# Patient Record
Sex: Female | Born: 1950 | Race: White | Hispanic: No | Marital: Married | Smoking: Never smoker
Health system: Southern US, Community
[De-identification: ages and names within clinical notes are randomized; demographics above are authoritative.]

## PROBLEM LIST (undated history)

## (undated) DIAGNOSIS — I10 Essential (primary) hypertension: Secondary | ICD-10-CM

## (undated) DIAGNOSIS — I493 Ventricular premature depolarization: Secondary | ICD-10-CM

## (undated) DIAGNOSIS — F0390 Unspecified dementia without behavioral disturbance: Secondary | ICD-10-CM

## (undated) DIAGNOSIS — R0789 Other chest pain: Secondary | ICD-10-CM

## (undated) DIAGNOSIS — I480 Paroxysmal atrial fibrillation: Secondary | ICD-10-CM

## (undated) DIAGNOSIS — E785 Hyperlipidemia, unspecified: Secondary | ICD-10-CM

## (undated) HISTORY — DX: Hyperlipidemia, unspecified: E78.5

## (undated) HISTORY — DX: Essential (primary) hypertension: I10

## (undated) HISTORY — DX: Paroxysmal atrial fibrillation: I48.0

## (undated) HISTORY — DX: Ventricular premature depolarization: I49.3

## (undated) HISTORY — DX: Other chest pain: R07.89

---

## 2007-12-19 ENCOUNTER — Ambulatory Visit: Payer: Self-pay | Admitting: Cardiology

## 2008-01-09 ENCOUNTER — Ambulatory Visit: Payer: Self-pay | Admitting: Cardiology

## 2010-11-24 NOTE — Assessment & Plan Note (Signed)
[facility]                          [facility] NOTE   CARLO, LORSON                      MRN:          [id number]  DATE:01/09/2008                            DOB:          1951-03-26    REFERRING PHYSICIAN:  Ernestina Penna, M.D.   HISTORY OF PRESENT ILLNESS:  The patient is a very pleasant 60 year old  female seen in the hospital when she was performing a treadmill test.  There was some concern at that time that the patient would have an  ectopic atrial pacemaker versus early atrial fibrillation.  The patient  wore a CardioNet monitor.  She had a single episode of a brief run of  atrial fibrillation and occasional PACs.  She otherwise has been  relatively asymptomatic.  Her dyspnea has also been improving.  She has  no chest pain.  An echocardiogram was done and showed no structural  heart disease.   MEDICATIONS:  Lisinopril/hydrochlorothiazide 20/12.5 mg p.o. daily.   PHYSICAL EXAMINATION:  VITAL SIGNS:  Blood pressure 144/84 and heart  rate is 74 beats per minute.  NECK:  Normal carotid upstroke and no carotid bruits.  LUNGS:  Clear breath sounds bilaterally.  HEART:  Regular rate and rhythm.  Normal S1 and S2.  No murmurs, rubs,  or gallops.  ABDOMEN:  Soft and nontender.  No rebound or guarding.  Good bowel  sounds.  EXTREMITY:  No cyanosis, clubbing, or edema.  NEUROLOGIC:  The patient is alert and oriented, grossly nonfocal.   Electrocardiogram, normal sinus rhythm.  No acute abnormalities.   PROBLEMS:  1. Hypertensive heart disease.  2. Rule out sick sinus syndrome with ectopic atrial beats.  3. Brief run of atrial fibrillation.  4. Normal left ventricular function.   PLAN:  1. The patient is doing well.  Dyspnea appears to be improving.  I do      not think that she has significant coronary artery disease.  Her      stress test did not suggest ischemic changes.  She also reports no      chest pain during a  stress testing.  2. I do not think the patient needs an ischemia workup at the present      time.  She is asymptomatic and I have reassured her about PACs and      a brief run of atrial fibrillation.  There is no indication for      Coumadin either.  3. The patient can follow up with [place] in the next 6 months to a year,      did tell her to take her      blood pressure and if she has recurrent palpitations, certainly a      low-dose beta-blocker can be added to her medical regimen.     Learta Codding, MD,FACC  Electronically Signed    GED/MedQ  DD: 01/09/2008  DT: 01/10/2008  Job #: [id number]   cc:   Ernestina Penna, M.D.

## 2011-01-20 ENCOUNTER — Emergency Department (HOSPITAL_COMMUNITY): Payer: Self-pay

## 2011-01-20 ENCOUNTER — Observation Stay (HOSPITAL_COMMUNITY)
Admission: EM | Admit: 2011-01-20 | Discharge: 2011-01-21 | Disposition: A | Discharge status: Home / Self Care | Payer: Self-pay | Attending: Cardiology | Admitting: Cardiology

## 2011-01-20 DIAGNOSIS — R079 Chest pain, unspecified: Secondary | ICD-10-CM

## 2011-01-20 DIAGNOSIS — R0789 Other chest pain: Principal | ICD-10-CM | POA: Insufficient documentation

## 2011-01-20 DIAGNOSIS — I4891 Unspecified atrial fibrillation: Secondary | ICD-10-CM | POA: Insufficient documentation

## 2011-01-20 DIAGNOSIS — E785 Hyperlipidemia, unspecified: Secondary | ICD-10-CM | POA: Insufficient documentation

## 2011-01-20 DIAGNOSIS — I10 Essential (primary) hypertension: Secondary | ICD-10-CM | POA: Insufficient documentation

## 2011-01-20 LAB — POCT I-STAT, CHEM 8
Creatinine, Ser: 0.8 mg/dL (ref 0.50–1.10)
Hemoglobin: 13.3 g/dL (ref 12.0–15.0)
Sodium: 141 mEq/L (ref 135–145)
TCO2: 25 mmol/L (ref 0–100)

## 2011-01-20 LAB — CK TOTAL AND CKMB (NOT AT ARMC): Total CK: 88 U/L (ref 7–177)

## 2011-01-20 LAB — CBC
Hemoglobin: 13.6 g/dL (ref 12.0–15.0)
MCH: 30.9 pg (ref 26.0–34.0)
MCHC: 35.2 g/dL (ref 30.0–36.0)
Platelets: 232 10*3/uL (ref 150–400)

## 2011-01-20 LAB — TSH: TSH: 1.53 u[IU]/mL (ref 0.350–4.500)

## 2011-01-21 DIAGNOSIS — R072 Precordial pain: Secondary | ICD-10-CM

## 2011-01-21 LAB — BASIC METABOLIC PANEL
BUN: 21 mg/dL (ref 6–23)
Calcium: 8.8 mg/dL (ref 8.4–10.5)
Creatinine, Ser: 0.76 mg/dL (ref 0.50–1.10)
GFR calc non Af Amer: >60 mL/min (ref >60)
Glucose, Bld: 95 mg/dL (ref 70–99)
Sodium: 140 mEq/L (ref 135–145)

## 2011-01-21 LAB — CARDIAC PANEL(CRET KIN+CKTOT+MB+TROPI)
CK, MB: 2.2 ng/mL (ref 0.3–4.0)
Relative Index: INVALID (ref 0.0–2.5)
Total CK: 71 U/L (ref 7–177)
Total CK: 71 U/L (ref 7–177)
Troponin I: <0.3 ng/mL (ref <0.30)

## 2011-01-21 LAB — HEMOGLOBIN A1C
Hgb A1c MFr Bld: 5.6 % (ref <5.7)
Mean Plasma Glucose: 114 mg/dL (ref <117)

## 2011-01-21 LAB — LIPID PANEL: LDL Cholesterol: 145 mg/dL — ABNORMAL HIGH (ref 0–99)

## 2011-01-22 NOTE — Discharge Summary (Addendum)
Joy Bender, Joy Bender NO.:  [id number]  MEDICAL RECORD NO.:  [id number]  LOCATION:  2040                         FACILITY:  [facility]  PHYSICIAN:  Luis Abed, MD, FACCDATE OF BIRTH:  1951/01/23  DATE OF ADMISSION:  01/20/2011 DATE OF DISCHARGE:  01/21/2011                              DISCHARGE SUMMARY   DISCHARGE DIAGNOSES: 1. Chest pain, felt atypical.     a.     Cardiac enzymes negative x3.     b.     Stress echocardiogram, January 21, 2011, showed normal wall      motion and poor exercise tolerance, premature atrial contractions      on monitor with exercise with no sustained supraventricular      tachycardia. 2. Hypertension. 3. Hyperlipidemia with LDL of 145, for outpatient followup with     primary care physician. 4. History of palpitations with CardioNet monitor in 2009 showing     single episode of a brief run of atrial fibrillation and occasional     premature atrial contractions. 5. History of normal left ventricular function. 6. 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  HOSPITAL COURSE:  Joy Bender is a 60 year old female with a history as outlined above who presented to [facility] complains of chest discomfort.  She was cutting her husband's hair when she had a sudden onset of acute chest pain.  She took an aspirin, laid down, but pain was not resolved.  She described it as a constant pressure sensation that was nonradiating and nonpositional.  When she presented to her PCP's office, she received a sublingual nitroglycerin and aspirin upon which she did feel relief.  She did note some palpitations over the last few weeks.  When seen in the ER which is where she was referred to, the patient had no chest pain.  The patient's initial troponin was negative. On telemetry, it was noted that she had PACs and PVCs without acute changes on EKG.  Cardiac enzymes were negative x3.  She was subsequently referred for a treadmill stress  echocardiogram which was performed today, January 21, 2011.  Although the patient had poor exercise tolerance, making it approximately 4-1/2 minute by the Bruce protocol, her images revealed normal wall motion.  Her EKG strips showed quickly elevated heart rate, some irregularity felt at this time to be consistent with PAC.  There was no sustained supraventricular tachycardia demonstrated per review by Dr. Myrtis Ser.  He has seen and examined her today and feels she is stable for discharge.  Per discussion with him, he would like the patient to increase activity to increase her exercise tolerance.  She was started on low-dose beta-blocker here in the hospital, however, her blood pressures are on the lower side, so we will hold off pending improvement of such.  DISCHARGE LABORATORY DATA:  WBC 6.8, hemoglobin 13.3, hematocrit 39, and platelet count 232.  Sodium 140, potassium 4.1, chloride 104, CO2 26, glucose 95, and BUN 21.  A1c 5.6.  Cardiac enzymes negative x3.  Total cholesterol 214, triglycerides 144, HDL 40, and LDL 145.  STUDIES: 1. No cardiopulmonary abnormality or acute or abnormal processes seen.     Ectasia/tortuosity  of thoracic aorta. 2. Stress echocardiogram, January 21, 2011 showed frequent PACs.  A     stress EKG with no prolonged SVT.  No definite ischemia.  No chest     pain.  EF was 60% with normal wall motion, both at baseline and     peak stress.  DISCHARGE MEDICATIONS: 1. Aspirin 81 mg daily. 2. Crestor 10 mg.  The patient is only taking this weekly.  She is     apparently skeptical and fearful of the medication.  Dr. Myrtis Ser would     like her to follow up with her primary care doctor to assess with     medicine in relation to her high cholesterol level. 3. Lisinopril/hydrochlorothiazide 20/12.5 mg daily.  DISPOSITION:  Joy Bender will be discharged in stable condition to home. She is instructed to increase activity slowly and may benefit from increasing her physical  activity on a regular basis with increased exercise tolerance.  She should follow a low-sodium heart-healthy diet. She will follow up with Dr. Andee Lineman on February 03, 2011 at 1:30 p.m.  She is to call Dr. Margarita Mail office.  She notices sustained palpitations as she may require another event monitor.  Her cholesterol panel was not ideal while here in the hospital, so she is instructed to call her PCP regarding the discussion about taking Crestor on a more regular basis.  DURATION OF DISCHARGE ENCOUNTER:  Greater than 30 minutes including physician and PA time.     Ronie Spies, P.A.C.   ______________________________ Luis Abed, MD, [facility]    DD/MEDQ  D:  01/21/2011  T:  01/21/2011  Job:  [id number]  cc:   Learta Codding, MD,FACC Ernestina Penna, M.D.  Electronically Signed by Willa Rough MD [facility] on 01/22/2011 04:57:43 PM Electronically Signed by Ronie Spies  on 01/29/2011 12:41:39 PM

## 2011-01-28 NOTE — H&P (Signed)
NAMEMIRANDA, FRESE NO.:  [id number]  MEDICAL RECORD NO.:  [id number]  LOCATION:  2040                         FACILITY:  [facility]  PHYSICIAN:  Arturo Morton. Riley Kill, MD, FACCDATE OF BIRTH:  1951-02-16  DATE OF ADMISSION:  01/20/2011 DATE OF DISCHARGE:  01/21/2011                             HISTORY & PHYSICAL   PRIMARY CARDIOLOGIST:  Dr. Lewayne Bunting  PRIMARY CARE PROVIDER:  Dr. Rudi Heap, [facility].  CHIEF COMPLAINT:  Chest pain.  HISTORY OF PRESENT ILLNESS:  This is a 60 year old Caucasian female with history of hypertension, hyperlipidemia as well as a normal stress test in 2009.  At this time, the patient also wore a CardioNet monitor and was noted to have episodes of atrial fibrillation with PVCs.  Since that time she has been doing well until morning of admission.  Today, she was cutting her husband's hair while he was sitting in chair in front in front of her and she had acute onset of left-sided chest pain.  The pain was described as a prickling sensation.  Pain is worse with deep inspiration as well as leaning forward.  The patient has also been playing with her grandchildren over the last several days.  After the onset of chest pain, the patient took aspirin and lying down, the pain did not resolve.  The pain is now constant and sometimes there is a pressure sensation that is nonradiating.  The patient presented to her primary care provider's office where she was given sublingual nitroglycerin and aspirin which did not make her feel improved.  She was then sent to the emergency department at [facility], for further evaluation.  Upon evaluation, the patient still has complaints of chest pain with deep inspiration as well as leaning forward.  She denies any pain at rest.  She also denies any associated symptoms of nausea, vomiting, diaphoresis, or shortness of breath.  Over the last several days, the patient says she has felt  anxious.  EKG at Dr. Kathi Der office showed sinus rhythm with occasional PVCs with no acute changes.  EKG on arrival at [facility] showed normal sinus rhythm without acute changes.  Initial troponin was negative x1.  The patient denies any recent fevers or chills.  Again, she has had some palpitations over the last few weeks. Cardiology was asked to admit the patient for further evaluation.  Of note, the patient also denies any exertional chest pain.  PAST MEDICAL HISTORY: 1. Hypertension. 2. Hyperlipidemia. 3. Episode of atrial fibrillation per CardioNet monitor in 2009. 4. Normal LV function. 5. PVCs. 6. Normal stress test in 2009.  SOCIAL HISTORY:  The patient lives in [place] with her husband.  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  She denies tobacco, alcohol, or illicit drug use.  The patient has noticed regular exercise program, but is active with her three grandchildren.  FAMILY HISTORY:  Mother died at age of 67 from Alzheimer's, brother passed away at age of 82 from colon cancer.  The patient denies any premature coronary artery disease.  ALLERGIES:  No known drug allergies.  ADMISSION MEDICATIONS: 1. Lisinopril/hydrochlorothiazide 20/5 mg daily. 2. Aspirin 81 mg, although the patient has  been out for approximately     a week. 3. Crestor as needed.  REVIEW OF SYSTEMS:  All pertinent positives and negatives as stated in the HPI.  Other systems have been reviewed and are negative.  PHYSICAL EXAMINATION:  VITAL SIGNS:  Temperature 97.9, pulse 81, respirations 11, blood pressure 133/74, and O2 saturation 100% on room air. GENERAL:  This is a very pleasant middle-aged female.  She is in no acute distress. HEENT:  Normal. NECK:  Supple without bruit or JVD. HEART:  Regular rate and rhythm with S1 and S2.  No murmur, rub, or gallop noted.  Pulses 2+ and equal bilaterally. CHEST WALL:  Tender to palpation at the second and third left intercostal space.  Again, pain  is also worse with sitting forward and inspiration. LUNGS:  Clear to auscultation bilaterally without wheezes, rales, or rhonchi. ABDOMEN:  Soft, nontender, positive bowel sounds x4. EXTREMITIES:  No clubbing, cyanosis, or edema. MUSCULOSKELETAL:  No joint deformities or effusions. NEURO:  Alert and oriented x3, cranial nerves II-XII grossly intact.  Chest x-ray showing no cardiopulmonary abnormalities or acute or abnormal process.  Patient has tortuosity of thoracic aorta.  EKG demonstrating normal sinus rhythm without acute ST or T wave changes.  LABORATORY DATA:  WBC is 6.8, hemoglobin 13.3, hematocrit 39.3, platelets 232, sodium 141, potassium 4.3, BUN 15, creatinine 0.80, troponin less than 0.3.  ASSESSMENT AND PLAN:  This is a delightful 60 year old Caucasian female with the onset of fairly severe left parasternal chest pain while cutting her husband's hair and she has also been lifting her grandchildren.  She denies exertional symptoms.  Her exam is negative except for positive tenderness to the left parasternal area as well as reproducible pain while sitting forward.  Her EKG shows specific ST-T- wave changes with PVCs.  Troponin is negative x1.  The patient's chest pain seems more likely secondary to costochondritis.  However, the patient will be admitted for overnight observation to rule out unstable angina.  We will cycle cardiac markers as well as an EKG.  If cardiac enzymes remain negative, the patient will have further followup with a stress echocardiogram.  With the patient's CBGs, we will also add a low-dose Lopressor and hold her hydrochlorothiazide.  We will check a TSH as well as a fasting lipid panel and 2-D echo for complete evaluation.  Further treatment will be dependent upon the above.     Leonette Monarch, PA-C   ______________________________ Arturo Morton Riley Kill, MD, [facility]    NB/MEDQ  D:  01/25/2011  T:  01/25/2011  Job:  [id number]  cc:   Dr. Rudi Heap  Electronically Signed by Alen Blew P.A. on 01/26/2011 11:22:54 AM Electronically Signed by Shawnie Pons MD [facility] on 01/28/2011 07:25:52 AM

## 2011-02-02 ENCOUNTER — Encounter: Payer: Self-pay | Admitting: Cardiology

## 2011-02-03 ENCOUNTER — Ambulatory Visit (INDEPENDENT_AMBULATORY_CARE_PROVIDER_SITE_OTHER): Payer: Self-pay | Admitting: Cardiology

## 2011-02-03 ENCOUNTER — Encounter: Payer: Self-pay | Admitting: Cardiology

## 2011-02-03 VITALS — BP 103/69 | HR 74 | Resp 18 | Ht 63.0 in | Wt 152.8 lb

## 2011-02-03 DIAGNOSIS — I10 Essential (primary) hypertension: Secondary | ICD-10-CM

## 2011-02-03 DIAGNOSIS — E785 Hyperlipidemia, unspecified: Secondary | ICD-10-CM

## 2011-02-03 DIAGNOSIS — R0789 Other chest pain: Secondary | ICD-10-CM

## 2011-02-03 NOTE — Assessment & Plan Note (Signed)
I calculated the patient's 10 year heart risk for coronary artery disease including myocardial infarction or cardiac death. It is less than 2%. According to the ATP III guidelines the patient does not need cholesterol lowering medications based on these risk ovulations. I've recommended therapeutic lifestyle changes and followup lipid panel and consideration can be given to lipid-lowering therapy if her LDL is greater than 160. Although were still talking about primary prevention here I told her that her risk reduction even in the setting is of a very small magnitude based on individual patient's benefit. The patient has agreed after discussing this with her to stop Crestor. This recommendation is firmly based on the guidelines.

## 2011-02-03 NOTE — Assessment & Plan Note (Signed)
  Chest pain secondary to musculoskeletal pain. No recurrence

## 2011-02-03 NOTE — Patient Instructions (Signed)
Continue all current medications. Follow up as needed  

## 2011-02-03 NOTE — Progress Notes (Signed)
HPI  The patient is a 60 year old female with no significant cardiovascular risk factors: The patient then never smoked and doesn't have a family history of CAD. Her LDL is 145 with an HDL 40 triglycerides of 144 and cholesterol 214. She was recently admitted in [place] with atypical chest pain probably musculoskeletal. Cardiac enzymes were negative x3. She had a stress echocardiogram which showed normal LV function and no evidence of ischemia. Although the patient has hypertension, today her blood pressure is normal even without taking her blood pressure medications this morning. She does take her medications in the evening. She also has ectasia and tortuosity of the aorta but no known plaquing of peripheral vascular disease. She was placed on Crestor approximately 6 months ago although she was somewhat leery of taking this medication and prior to her hospitalization she was only taking it once a week. The lab results described above were obtained when the patient was only taking Crestor once a week. She does complain of some weakness towards the evening. She's not sure what her blood pressures running at that time. Chest had no recurrent chest pain shortness of breath orthopnea PND. Has a prior history of palpitations and had a CardioNet monitor in 2009 but has had no recurrent problems.  No Known Allergies  Current Outpatient Prescriptions on File Prior to Visit  Medication Sig Dispense Refill  . aspirin 81 MG tablet Take 81 mg by mouth daily.        Marland Kitchen lisinopril-hydrochlorothiazide (PRINZIDE,ZESTORETIC) 20-12.5 MG per tablet Take 1 tablet by mouth daily.        . rosuvastatin (CRESTOR) 10 MG tablet Take 10 mg by mouth daily.          Past Medical History  Diagnosis Date  . Hypertension   . Hyperlipidemia   . Atrial fibrillation     per Cardionet Monitor in 2009  . PVC's (premature ventricular contractions)     No past surgical history on file.  Family History  Problem Relation Age of  Onset  . Cancer Brother     History   Social History  . Marital Status: Married    Spouse Name: N/A    Number of Children: 3  . Years of Education: N/A   Occupational History  . [employer]    Social History Main Topics  . Smoking status: Never Smoker   . Smokeless tobacco: Not on file  . Alcohol Use: No  . Drug Use: No  . Sexually Active: Not on file   Other Topics Concern  . Not on file   Social History Narrative  . No narrative on file   [id number] positives as outlined above. The remainder of the 18  point review of systems is negative  PHYSICAL EXAM BP 103/69  Pulse 74  Resp 18  Ht 5\' 3"  (1.6 m)  Wt 152 lb 12.8 oz (69.31 kg)  BMI 27.07 kg/m2  SpO2 98%  General: Well-developed, well-nourished in no distress Head: Normocephalic and atraumatic Eyes:PERRLA/EOMI intact, conjunctiva and lids normal Ears: No deformity or lesions Mouth:normal dentition, normal posterior pharynx Neck: Supple, no JVD.  No masses, thyromegaly or abnormal cervical nodes Lungs: Normal breath sounds bilaterally without wheezing.  Normal percussion Cardiac: regular rate and rhythm with normal S1 and S2, no S3 or S4.  PMI is normal.  No pathological murmurs Abdomen: Normal bowel sounds, abdomen is soft and nontender without masses, organomegaly or hernias noted.  No hepatosplenomegaly MSK: Back normal, normal gait muscle strength and  tone normal Vascular: Pulse is normal in all 4 extremities Extremities: No peripheral pitting edema Neurologic: Alert and oriented x 3 Skin: Intact without lesions or rashes Lymphatics: No significant adenopathy Psychologic: Normal affect   ECG: Not available  ASSESSMENT AND PLAN

## 2012-11-23 ENCOUNTER — Other Ambulatory Visit: Payer: Self-pay | Admitting: Nurse Practitioner

## 2012-11-25 ENCOUNTER — Other Ambulatory Visit: Payer: Self-pay | Admitting: Nurse Practitioner

## 2012-11-27 ENCOUNTER — Other Ambulatory Visit: Payer: Self-pay | Admitting: Nurse Practitioner

## 2013-01-15 ENCOUNTER — Telehealth: Payer: Self-pay | Admitting: Family Medicine

## 2013-01-15 MED ORDER — LISINOPRIL-HYDROCHLOROTHIAZIDE 20-12.5 MG PO TABS: 1.0000 | ORAL_TABLET | Freq: Every day | ORAL | Status: DC

## 2013-01-15 NOTE — Telephone Encounter (Signed)
Needs appt for BP med refill. No appts available today. Refilled meds x 1 and appt made

## 2013-03-29 ENCOUNTER — Other Ambulatory Visit: Payer: Self-pay | Admitting: Family Medicine

## 2013-04-01 ENCOUNTER — Other Ambulatory Visit: Payer: Self-pay | Admitting: Family Medicine

## 2013-04-06 ENCOUNTER — Ambulatory Visit: Payer: Self-pay | Admitting: Family Medicine

## 2013-04-11 ENCOUNTER — Ambulatory Visit: Payer: Self-pay | Admitting: Family Medicine

## 2014-05-17 ENCOUNTER — Telehealth: Payer: Self-pay | Admitting: Nurse Practitioner

## 2014-05-20 NOTE — Telephone Encounter (Signed)
CALLED PT AND SHE HAS NOT BEEN SEEN AT OUR OFFICE >3YEARS. PT WOULD BE NEW. PT GIVEN FIRST AVAILABLE APPT AND TOLD TO GO TO ER IF NOT ABLE TO WAIT FOR APPT. PT VERBALIZED UNDERSTANDING.

## 2014-07-17 ENCOUNTER — Ambulatory Visit (INDEPENDENT_AMBULATORY_CARE_PROVIDER_SITE_OTHER): Payer: Medicare Other

## 2014-07-17 ENCOUNTER — Encounter (INDEPENDENT_AMBULATORY_CARE_PROVIDER_SITE_OTHER): Payer: Self-pay

## 2014-07-17 ENCOUNTER — Ambulatory Visit (INDEPENDENT_AMBULATORY_CARE_PROVIDER_SITE_OTHER): Payer: Medicare Other | Admitting: Nurse Practitioner

## 2014-07-17 ENCOUNTER — Other Ambulatory Visit: Payer: Self-pay | Admitting: Nurse Practitioner

## 2014-07-17 ENCOUNTER — Encounter: Payer: Self-pay | Admitting: Nurse Practitioner

## 2014-07-17 VITALS — BP 175/88 | HR 83 | Temp 96.8°F | Ht 63.0 in | Wt 162.0 lb

## 2014-07-17 DIAGNOSIS — I10 Essential (primary) hypertension: Secondary | ICD-10-CM | POA: Diagnosis not present

## 2014-07-17 DIAGNOSIS — R012 Other cardiac sounds: Secondary | ICD-10-CM

## 2014-07-17 DIAGNOSIS — E785 Hyperlipidemia, unspecified: Secondary | ICD-10-CM | POA: Insufficient documentation

## 2014-07-17 DIAGNOSIS — F411 Generalized anxiety disorder: Secondary | ICD-10-CM | POA: Diagnosis not present

## 2014-07-17 MED ORDER — ROSUVASTATIN CALCIUM 10 MG PO TABS: 10.0000 mg | ORAL_TABLET | Freq: Every day | ORAL | Status: DC

## 2014-07-17 MED ORDER — ALPRAZOLAM 0.25 MG PO TABS: 0.2500 mg | ORAL_TABLET | Freq: Two times a day (BID) | ORAL | Status: DC

## 2014-07-17 MED ORDER — LISINOPRIL-HYDROCHLOROTHIAZIDE 20-12.5 MG PO TABS: 1.0000 | ORAL_TABLET | Freq: Every day | ORAL | Status: DC

## 2014-07-17 NOTE — Patient Instructions (Signed)

## 2014-07-17 NOTE — Progress Notes (Signed)
Subjective:    Patient ID: Joy Bender, female    DOB: 1951-04-21, 64 y.o.   MRN: [id number]   Patient here today for follow up of chronic medical problems. Was without insurance for awhile so quit taking all meds- now has [employer] nad needs to go back on meds. C/O being anxious at times and worrying a lot- wants something  To help without having to take daily.   Hypertension This is a chronic problem. The current episode started more than 1 year ago. The problem is unchanged. The problem is controlled. Risk factors for coronary artery disease include dyslipidemia and post-menopausal state. Past treatments include ACE inhibitors and diuretics. The current treatment provides moderate improvement. Compliance problems include diet, exercise and medication cost.   Hyperlipidemia This is a chronic problem. The current episode started more than 1 year ago. The problem is uncontrolled. Recent lipid tests were reviewed and are high. Compliance problems include adherence to diet, adherence to exercise and medication cost.  Risk factors for coronary artery disease include dyslipidemia, hypertension and post-menopausal.      Review of Systems  Constitutional: Negative.   HENT: Negative.   Respiratory: Negative.   Cardiovascular: Negative.   Gastrointestinal: Negative.   Genitourinary: Negative.   Neurological: Negative.   Psychiatric/Behavioral: Negative.   All other systems reviewed and are negative.      Objective:   Physical Exam  Constitutional: She is oriented to person, place, and time. She appears well-developed and well-nourished.  HENT:  Nose: Nose normal.  Mouth/Throat: Oropharynx is clear and moist.  Eyes: EOM are normal.  Neck: Trachea normal, normal range of motion and full passive range of motion without pain. Neck supple. No JVD present. Carotid bruit is not present. No thyromegaly present.  Cardiovascular: Normal rate, regular rhythm and intact distal pulses.   Exam reveals gallop (s1 split). Exam reveals no friction rub.   No murmur heard. Pulmonary/Chest: Effort normal and breath sounds normal.  Abdominal: Soft. Bowel sounds are normal. She exhibits no distension and no mass. There is no tenderness.  Musculoskeletal: Normal range of motion.  Lymphadenopathy:    She has no cervical adenopathy.  Neurological: She is alert and oriented to person, place, and time. She has normal reflexes.  Skin: Skin is warm and dry.  Psychiatric: She has a normal mood and affect. Her behavior is normal. Judgment and thought content normal.   BP 175/88 mmHg  Pulse 83  Temp(Src) 96.8 F (36 C) (Oral)  Ht _0  (1.6 m)  Wt 162 lb (73.483 kg)  BMI 28.70 kg/m2  Adella Nissen, FNP Chest x ray- normal findings-Preliminary reading by Ronnald Collum, FNP  [place]       Assessment & Plan:  1. Split S1 (first heart sound) - DG Chest 2 View; Future - EKG 12-Lead  2. Essential hypertension Do not add salt to diet - CMP14+EGFR - lisinopril-hydrochlorothiazide (PRINZIDE,ZESTORETIC) 20-12.5 MG per tablet; Take 1 tablet by mouth daily.  Dispense: 90 tablet; Refill: 1  3. Hyperlipidemia with target LDL less than 100 Watch fat in diet - NMR, lipoprofile - rosuvastatin (CRESTOR) 10 MG tablet; Take 1 tablet (10 mg total) by mouth daily.  Dispense: 30 tablet; Refill: 5  4. GAD (generalized anxiety disorder) Stress management - ALPRAZolam (XANAX) 0.25 MG tablet; Take 1 tablet (0.25 mg total) by mouth 2 (two) times daily.  Dispense: 60 tablet; Refill: 1    Labs pending Health maintenance reviewed Diet and exercise encouraged Continue all meds  Follow up  In 3 month   [place], FNP

## 2014-07-18 LAB — NMR, LIPOPROFILE
CHOLESTEROL: 216 mg/dL — AB (ref 100–199)
HDL Cholesterol by NMR: 42 mg/dL (ref >39)
HDL Particle Number: 28.7 umol/L — ABNORMAL LOW (ref >=30.5)
LDL PARTICLE NUMBER: 2003 nmol/L — AB (ref <1000)
LDL SIZE: 21.2 nm (ref >20.5)
LDL-C: 134 mg/dL — AB (ref 0–99)
LP-IR Score: 62 — ABNORMAL HIGH (ref <=45)
Small LDL Particle Number: 853 nmol/L — ABNORMAL HIGH (ref <=527)
Triglycerides by NMR: 198 mg/dL — ABNORMAL HIGH (ref 0–149)

## 2014-07-18 LAB — CMP14+EGFR
ALBUMIN: 4.4 g/dL (ref 3.6–4.8)
ALK PHOS: 102 IU/L (ref 39–117)
ALT: 14 IU/L (ref 0–32)
AST: 21 IU/L (ref 0–40)
Albumin/Globulin Ratio: 1.7 (ref 1.1–2.5)
BUN/Creatinine Ratio: 18 (ref 11–26)
BUN: 14 mg/dL (ref 8–27)
CALCIUM: 9.4 mg/dL (ref 8.7–10.3)
CHLORIDE: 100 mmol/L (ref 97–108)
CO2: 25 mmol/L (ref 18–29)
CREATININE: 0.78 mg/dL (ref 0.57–1.00)
GFR calc Af Amer: 92 mL/min/{1.73_m2} (ref >59)
GFR calc non Af Amer: 80 mL/min/{1.73_m2} (ref >59)
GLOBULIN, TOTAL: 2.6 g/dL (ref 1.5–4.5)
Glucose: 91 mg/dL (ref 65–99)
Potassium: 3.8 mmol/L (ref 3.5–5.2)
Sodium: 140 mmol/L (ref 134–144)
Total Bilirubin: 0.3 mg/dL (ref 0.0–1.2)
Total Protein: 7 g/dL (ref 6.0–8.5)

## 2014-07-24 ENCOUNTER — Telehealth: Payer: Self-pay | Admitting: Nurse Practitioner

## 2014-07-24 MED ORDER — ATORVASTATIN CALCIUM 40 MG PO TABS: 40.0000 mg | ORAL_TABLET | Freq: Every day | ORAL | Status: DC

## 2014-07-24 NOTE — Telephone Encounter (Signed)
crestor changed to lipitor 

## 2014-07-25 NOTE — Telephone Encounter (Signed)
Patient aware.

## 2014-09-18 ENCOUNTER — Other Ambulatory Visit: Payer: Self-pay | Admitting: Nurse Practitioner

## 2014-09-18 NOTE — Telephone Encounter (Signed)
[facility]

## 2014-09-18 NOTE — Telephone Encounter (Signed)
Please call in xanax with 1 refills 

## 2014-09-18 NOTE — Telephone Encounter (Signed)
Please advise on refill, last seen 07/17/14 - given xanax #[phone] with 1 refill.  No follow appointment scheduled.

## 2014-11-28 ENCOUNTER — Other Ambulatory Visit: Payer: Self-pay | Admitting: Nurse Practitioner

## 2014-11-28 NOTE — Telephone Encounter (Signed)
RX called into [facility] 

## 2014-11-28 NOTE — Telephone Encounter (Signed)
Please call in xanax with 1 refills 

## 2014-11-28 NOTE — Telephone Encounter (Signed)
Last filled 10/21/14, last seen 07/17/14. Pt uses [facility]

## 2014-11-30 ENCOUNTER — Other Ambulatory Visit: Payer: Self-pay | Admitting: Nurse Practitioner

## 2014-12-31 ENCOUNTER — Other Ambulatory Visit: Payer: Self-pay | Admitting: Nurse Practitioner

## 2014-12-31 NOTE — Telephone Encounter (Signed)
Last seen 07/17/14 MMM If approved route to nurse to call into [facility]

## 2014-12-31 NOTE — Telephone Encounter (Signed)
Please call in xanax with 0 refills no more refills without being seen  

## 2014-12-31 NOTE — Telephone Encounter (Signed)
RX called into [facility].

## 2015-01-31 ENCOUNTER — Other Ambulatory Visit: Payer: Self-pay | Admitting: Nurse Practitioner

## 2015-01-31 NOTE — Telephone Encounter (Signed)
Last filled 12/31/14, not seen since 07/17/14. MMM pt. Route to pool if approved

## 2015-02-03 NOTE — Telephone Encounter (Signed)
RX for Xanax called in to [facility] per [employer]

## 2015-02-16 ENCOUNTER — Other Ambulatory Visit: Payer: Self-pay | Admitting: Nurse Practitioner

## 2015-02-19 ENCOUNTER — Other Ambulatory Visit: Payer: Self-pay | Admitting: Nurse Practitioner

## 2015-02-21 ENCOUNTER — Other Ambulatory Visit: Payer: Self-pay | Admitting: Nurse Practitioner

## 2015-02-21 MED ORDER — LISINOPRIL-HYDROCHLOROTHIAZIDE 20-12.5 MG PO TABS: 1.0000 | ORAL_TABLET | Freq: Every day | ORAL | Status: DC

## 2015-02-21 NOTE — Telephone Encounter (Signed)
Only given 30 day suply of lisinopril-Patient NTBS for follow up and lab work'

## 2015-03-04 ENCOUNTER — Other Ambulatory Visit: Payer: Self-pay | Admitting: Family

## 2015-03-04 NOTE — Telephone Encounter (Signed)
Last seen 07/17/14, last filled 02/03/15. Pt uses [facility]

## 2015-03-04 NOTE — Telephone Encounter (Signed)
Please call in xanax with 1 refills 

## 2015-03-04 NOTE — Telephone Encounter (Signed)
rx called into pharmacy

## 2015-03-13 ENCOUNTER — Ambulatory Visit: Payer: Medicare Other | Admitting: Nurse Practitioner

## 2015-03-31 ENCOUNTER — Ambulatory Visit: Payer: Medicare Other | Admitting: Nurse Practitioner

## 2015-04-16 ENCOUNTER — Other Ambulatory Visit: Payer: Self-pay | Admitting: *Deleted

## 2015-04-16 MED ORDER — LISINOPRIL-HYDROCHLOROTHIAZIDE 20-12.5 MG PO TABS: 1.0000 | ORAL_TABLET | Freq: Every day | ORAL | Status: DC

## 2015-04-16 NOTE — Telephone Encounter (Signed)
According to insurance company patient is only 80% compliant with taking her HTN meds. She does have a follow up appt scheduled but will not have enough medication to last until that appt on 04/25/15. Refill given. Patient will keep appt and request refills at that visit as well.

## 2015-04-25 ENCOUNTER — Ambulatory Visit: Payer: Medicare Other | Admitting: Nurse Practitioner

## 2015-04-28 ENCOUNTER — Encounter: Payer: Self-pay | Admitting: Nurse Practitioner

## 2015-05-10 ENCOUNTER — Other Ambulatory Visit: Payer: Self-pay | Admitting: Nurse Practitioner

## 2015-05-12 ENCOUNTER — Telehealth: Payer: Self-pay | Admitting: Nurse Practitioner

## 2015-05-12 NOTE — Telephone Encounter (Signed)
Refill denied   Patient NTBS for follow up and lab work   

## 2015-05-12 NOTE — Telephone Encounter (Signed)
NA at home phone to make appt

## 2015-05-12 NOTE — Telephone Encounter (Signed)
Not seen since 07/2014 

## 2015-05-19 ENCOUNTER — Encounter: Payer: Self-pay | Admitting: Family Medicine

## 2015-05-19 ENCOUNTER — Ambulatory Visit (INDEPENDENT_AMBULATORY_CARE_PROVIDER_SITE_OTHER): Payer: Medicare Other | Admitting: Family Medicine

## 2015-05-19 VITALS — BP 125/76 | HR 80 | Temp 97.2°F | Ht 63.0 in | Wt 156.6 lb

## 2015-05-19 DIAGNOSIS — I10 Essential (primary) hypertension: Secondary | ICD-10-CM | POA: Diagnosis not present

## 2015-05-19 DIAGNOSIS — Z1211 Encounter for screening for malignant neoplasm of colon: Secondary | ICD-10-CM | POA: Diagnosis not present

## 2015-05-19 DIAGNOSIS — F411 Generalized anxiety disorder: Secondary | ICD-10-CM | POA: Diagnosis not present

## 2015-05-19 DIAGNOSIS — E785 Hyperlipidemia, unspecified: Secondary | ICD-10-CM | POA: Diagnosis not present

## 2015-05-19 MED ORDER — ATORVASTATIN CALCIUM 40 MG PO TABS: 40.0000 mg | ORAL_TABLET | Freq: Every day | ORAL | Status: DC

## 2015-05-19 MED ORDER — ALPRAZOLAM 0.25 MG PO TABS: 0.2500 mg | ORAL_TABLET | Freq: Two times a day (BID) | ORAL | Status: DC | PRN

## 2015-05-19 MED ORDER — SERTRALINE HCL 50 MG PO TABS
50.0000 mg | ORAL_TABLET | Freq: Every day | ORAL | Status: DC
Start: 2015-05-19 — End: 2016-12-27

## 2015-05-19 MED ORDER — LISINOPRIL-HYDROCHLOROTHIAZIDE 20-12.5 MG PO TABS: 1.0000 | ORAL_TABLET | Freq: Every day | ORAL | Status: DC

## 2015-05-19 NOTE — Assessment & Plan Note (Signed)
Using daily Xanax, recommend Zoloft and counseling

## 2015-05-19 NOTE — Assessment & Plan Note (Signed)
Patient's blood pressures controlled, will continue medications

## 2015-05-19 NOTE — Assessment & Plan Note (Signed)
Restart medications as patient stopped them on her own. We'll recheck in January

## 2015-05-19 NOTE — Progress Notes (Signed)
BP 125/76 mmHg  Pulse 80  Temp(Src) 97.2 F (36.2 C) (Oral)  Ht _0  (1.6 m)  Wt 156 lb 9.6 oz (71.033 kg)  BMI 27.75 kg/m2   Subjective:    Patient ID: Joy Bender, female    DOB: 10-Oct-1950, 64 y.o.   MRN: [id number]  HPI: Joy Bender is a 64 y.o. female presenting on 05/19/2015 for Hypertension and Referral   HPI Hypertension Patient comes in for recheck of blood pressure area her blood pressure today is 125/76. She took lisinopril hydrochlorothiazide 20-12.5. Patient denies headaches, blurred vision, chest pains, shortness of breath, or weakness. Denies any side effects from medication and is content with current medication.   Hyperlipidemia  Patient took atorvastatin 40 in the past. She denies chest pain, this of breath, weakness or numbness.  General anxiety disorder She has been on Xanax daily for this but has not been on anything else for maintenance. She denies any suicidal ideation but does admit that she has crying episodes and feelings of sadness and feelings of anxiety and panic attacks. She has been having sleep issues as well mainly falling asleep because her mind is racing.  Relevant past medical, surgical, family and social history reviewed and updated as indicated. Interim medical history since our last visit reviewed. Allergies and medications reviewed and updated.  Review of Systems  Constitutional: Negative for fever and chills.  HENT: Negative for congestion, ear discharge and ear pain.   Eyes: Negative for redness and visual disturbance.  Respiratory: Negative for chest tightness and shortness of breath.   Cardiovascular: Negative for chest pain and leg swelling.  Genitourinary: Negative for dysuria and difficulty urinating.  Musculoskeletal: Negative for back pain and gait problem.  Skin: Negative for rash.  Neurological: Negative for dizziness, light-headedness and headaches.  Psychiatric/Behavioral: Positive for dysphoric mood and agitation.  Negative for suicidal ideas, hallucinations, behavioral problems, confusion, sleep disturbance, self-injury and decreased concentration. The patient is nervous/anxious.   All other systems reviewed and are negative.   Per HPI unless specifically indicated above     Medication List       This list is accurate as of: 05/19/15  3:05 PM.  Always use your most recent med list.               ALPRAZolam 0.25 MG tablet  Commonly known as:  XANAX  Take 1 tablet (0.25 mg total) by mouth 2 (two) times daily as needed.     aspirin 81 MG tablet  Take 81 mg by mouth daily.     atorvastatin 40 MG tablet  Commonly known as:  LIPITOR  Take 1 tablet (40 mg total) by mouth daily.     lisinopril-hydrochlorothiazide 20-12.5 MG tablet  Commonly known as:  PRINZIDE,ZESTORETIC  Take 1 tablet by mouth daily.     sertraline 50 MG tablet  Commonly known as:  ZOLOFT  Take 1 tablet (50 mg total) by mouth daily.           Objective:    BP 125/76 mmHg  Pulse 80  Temp(Src) 97.2 F (36.2 C) (Oral)  Ht _1  (1.6 m)  Wt 156 lb 9.6 oz (71.033 kg)  BMI 27.75 kg/m2  Wt Readings from Last 3 Encounters:  05/19/15 156 lb 9.6 oz (71.033 kg)  07/17/14 162 lb (73.483 kg)  02/03/11 152 lb 12.8 oz (69.31 kg)    Physical Exam  Constitutional: She is oriented to person, place, and time. She appears well-developed and  well-nourished. No distress.  Eyes: Conjunctivae and EOM are normal. Pupils are equal, round, and reactive to light.  Cardiovascular: Normal rate, regular rhythm, normal heart sounds and intact distal pulses.   No murmur heard. Pulmonary/Chest: Effort normal and breath sounds normal. No respiratory distress. She has no wheezes.  Musculoskeletal: Normal range of motion. She exhibits no edema or tenderness.  Neurological: She is alert and oriented to person, place, and time. Coordination normal.  Skin: Skin is warm and dry. No rash noted. She is not diaphoretic.  Psychiatric: Her speech is  normal and behavior is normal. Judgment and thought content normal. Her mood appears anxious. Her affect is labile. Her affect is not angry, not blunt and not inappropriate. She exhibits a depressed mood.  Nursing note and vitals reviewed.   Results for orders placed or performed in visit on 07/17/14  CMP14+EGFR  Result Value Ref Range   Glucose 91 65 - 99 mg/dL   BUN 14 8 - 27 mg/dL   Creatinine, Ser 0.78 0.57 - 1.00 mg/dL   GFR calc non Af Amer 80 >59 mL/min/1.73   GFR calc Af Amer 92 >59 mL/min/1.73   BUN/Creatinine Ratio 18 11 - 26   Sodium 140 134 - 144 mmol/L   Potassium 3.8 3.5 - 5.2 mmol/L   Chloride 100 97 - 108 mmol/L   CO2 25 18 - 29 mmol/L   Calcium 9.4 8.7 - 10.3 mg/dL   Total Protein 7.0 6.0 - 8.5 g/dL   Albumin 4.4 3.6 - 4.8 g/dL   Globulin, Total 2.6 1.5 - 4.5 g/dL   Albumin/Globulin Ratio 1.7 1.1 - 2.5   Total Bilirubin 0.3 0.0 - 1.2 mg/dL   Alkaline Phosphatase 102 39 - 117 IU/L   AST 21 0 - 40 IU/L   ALT 14 0 - 32 IU/L      Assessment & Plan:   Problem List Items Addressed This Visit      Cardiovascular and Mediastinum   Hypertension - Primary    Patient's blood pressures controlled, will continue medications      Relevant Medications   lisinopril-hydrochlorothiazide (PRINZIDE,ZESTORETIC) 20-12.5 MG tablet   atorvastatin (LIPITOR) 40 MG tablet     Other   Hyperlipidemia    Restart medications as patient stopped them on her own. We'll recheck in January      Relevant Medications   lisinopril-hydrochlorothiazide (PRINZIDE,ZESTORETIC) 20-12.5 MG tablet   atorvastatin (LIPITOR) 40 MG tablet   GAD (generalized anxiety disorder)    Using daily Xanax, recommend Zoloft and counseling      Relevant Medications   sertraline (ZOLOFT) 50 MG tablet   ALPRAZolam (XANAX) 0.25 MG tablet    Other Visit Diagnoses    Screen for colon cancer        Relevant Orders    Ambulatory referral to Gastroenterology        Follow up plan: Return in about 2 months  (around 07/19/2015), or if symptoms worsen or fail to improve, for Annual and labs.  Caryl Pina, MD [facility] 05/19/2015, 3:05 PM

## 2015-05-20 ENCOUNTER — Encounter: Payer: Self-pay | Admitting: Internal Medicine

## 2015-05-26 ENCOUNTER — Telehealth: Payer: Self-pay | Admitting: Family Medicine

## 2015-05-26 NOTE — Telephone Encounter (Signed)
Done 05/19/15

## 2015-06-19 ENCOUNTER — Telehealth: Payer: Self-pay | Admitting: Nurse Practitioner

## 2015-07-22 ENCOUNTER — Ambulatory Visit: Payer: Medicare Other | Admitting: Family Medicine

## 2015-07-23 ENCOUNTER — Encounter: Payer: Self-pay | Admitting: Internal Medicine

## 2015-08-02 DIAGNOSIS — H40033 Anatomical narrow angle, bilateral: Secondary | ICD-10-CM | POA: Diagnosis not present

## 2015-08-02 DIAGNOSIS — H2513 Age-related nuclear cataract, bilateral: Secondary | ICD-10-CM | POA: Diagnosis not present

## 2015-09-01 ENCOUNTER — Other Ambulatory Visit: Payer: Self-pay | Admitting: Family Medicine

## 2015-09-02 NOTE — Telephone Encounter (Signed)
Please call in xanax with 0 refills Last refill without being seen  

## 2015-09-02 NOTE — Telephone Encounter (Signed)
Refill called to [facility] 

## 2015-09-02 NOTE — Telephone Encounter (Signed)
Pt aware refill called to pharmacy & appt needs to be made w/n the month

## 2015-10-24 ENCOUNTER — Other Ambulatory Visit: Payer: Self-pay | Admitting: Nurse Practitioner

## 2015-10-28 ENCOUNTER — Other Ambulatory Visit: Payer: Self-pay | Admitting: Nurse Practitioner

## 2015-12-17 ENCOUNTER — Other Ambulatory Visit: Payer: Self-pay | Admitting: Nurse Practitioner

## 2015-12-18 NOTE — Telephone Encounter (Signed)
Last seen 05/29/15  Dr Dettinger   MMM PCP  [facility]

## 2015-12-18 NOTE — Telephone Encounter (Signed)
Please call in xanax with 0 refills Last refill without being seen  

## 2015-12-21 ENCOUNTER — Other Ambulatory Visit: Payer: Self-pay | Admitting: Nurse Practitioner

## 2016-01-18 ENCOUNTER — Other Ambulatory Visit: Payer: Self-pay | Admitting: Family Medicine

## 2016-01-26 ENCOUNTER — Telehealth: Payer: Self-pay | Admitting: Nurse Practitioner

## 2016-01-26 NOTE — Telephone Encounter (Signed)
Denied.

## 2016-03-10 ENCOUNTER — Telehealth: Payer: Self-pay | Admitting: Nurse Practitioner

## 2016-04-03 ENCOUNTER — Other Ambulatory Visit: Payer: Self-pay | Admitting: Family Medicine

## 2016-04-05 NOTE — Telephone Encounter (Signed)
Last refill without being seen 

## 2016-04-06 ENCOUNTER — Other Ambulatory Visit: Payer: Self-pay | Admitting: Family Medicine

## 2016-05-08 ENCOUNTER — Other Ambulatory Visit: Payer: Self-pay | Admitting: Nurse Practitioner

## 2016-06-15 ENCOUNTER — Other Ambulatory Visit: Payer: Self-pay | Admitting: Family Medicine

## 2016-06-15 NOTE — Telephone Encounter (Signed)
Patient last seen 05/19/15.  Please advise and route to pools

## 2016-07-16 ENCOUNTER — Other Ambulatory Visit: Payer: Self-pay | Admitting: Family Medicine

## 2016-08-11 ENCOUNTER — Telehealth: Payer: Self-pay | Admitting: Physician Assistant

## 2016-08-11 NOTE — Telephone Encounter (Signed)
Prescription sent to pharmacy  Exposed to flu A

## 2016-08-12 MED ORDER — OSELTAMIVIR PHOSPHATE 75 MG PO CAPS: 75.0000 mg | ORAL_CAPSULE | Freq: Every day | ORAL | 0 refills | Status: AC

## 2016-12-24 ENCOUNTER — Other Ambulatory Visit: Payer: Self-pay | Admitting: Nurse Practitioner

## 2016-12-24 ENCOUNTER — Telehealth: Payer: Self-pay | Admitting: Family Medicine

## 2016-12-24 ENCOUNTER — Other Ambulatory Visit: Payer: Self-pay

## 2016-12-24 NOTE — Patient Outreach (Signed)
[facility] Care Management  12/24/2016  Susa RaringDarlene C Can [birth year] [id number]   Medication Adherence call to Mrs. Milly Jakobarlene Jarrard Mrs. Manson PasseyBrown [facility] for her lisinopril/hctz 20/12.5 mg we call the patients and spoke to patients daughter Mrs. Kathe  She said her mom have some medication but she ask if we could call doctor and the pharmacy to get a 90 days supply we call doctor's office and they say patient needs to make an appointment because she has not been seen over a year . Call the patient back told her daughter that she needs an appointment from doctors office patient said they are going to make an appointment. I also offer her with medication assistance patients daughter said she does not need help the she was going to help her mom with all her medication.    Lillia AbedAna Ollison-Moran CPhT Pharmacy Technician [facility] Care [facility] Direct Dial [phone]  Fax [phone] [email or web address]

## 2016-12-24 NOTE — Telephone Encounter (Signed)
Informed [facility] that pt has not been seen since 05/2015 Pt was informed that she must be seen for refill appt scheduled

## 2016-12-27 ENCOUNTER — Encounter: Payer: Self-pay | Admitting: Family Medicine

## 2016-12-27 ENCOUNTER — Ambulatory Visit (INDEPENDENT_AMBULATORY_CARE_PROVIDER_SITE_OTHER): Payer: Medicare Other

## 2016-12-27 ENCOUNTER — Ambulatory Visit (INDEPENDENT_AMBULATORY_CARE_PROVIDER_SITE_OTHER): Payer: Medicare Other | Admitting: Family Medicine

## 2016-12-27 VITALS — BP 120/73 | HR 81 | Temp 98.6°F | Ht 63.0 in | Wt 151.4 lb

## 2016-12-27 DIAGNOSIS — Z1159 Encounter for screening for other viral diseases: Secondary | ICD-10-CM | POA: Diagnosis not present

## 2016-12-27 DIAGNOSIS — Z780 Asymptomatic menopausal state: Secondary | ICD-10-CM | POA: Diagnosis not present

## 2016-12-27 DIAGNOSIS — E782 Mixed hyperlipidemia: Secondary | ICD-10-CM | POA: Diagnosis not present

## 2016-12-27 DIAGNOSIS — I10 Essential (primary) hypertension: Secondary | ICD-10-CM | POA: Diagnosis not present

## 2016-12-27 DIAGNOSIS — F411 Generalized anxiety disorder: Secondary | ICD-10-CM | POA: Diagnosis not present

## 2016-12-27 NOTE — Progress Notes (Signed)
BP 120/73   Pulse 81   Temp 98.6 F (37 C) (Oral)   Ht 5' 3"  (1.6 m)   Wt 151 lb 6 oz (68.7 kg)   BMI 26.81 kg/m    Subjective:    Patient ID: Joy Bender, female    DOB: 04/30/1951, 66 y.o.   MRN: [id number]  HPI: Joy Bender is a 66 y.o. female presenting on 12/27/2016 for Hypertension (medication refill)   HPI Hypertension Patient is currently on Lisinopril-hydrochlorothiazide, and their blood pressure today is 120/73. Patient denies any lightheadedness or dizziness. Patient denies headaches, blurred vision, chest pains, shortness of breath, or weakness. Denies any side effects from medication and is content with current medication.   Hyperlipidemia Patient is coming in for recheck of his hyperlipidemia. The patient is currently taking Lipitor. They deny any issues with myalgias or history of liver damage from it. They deny any focal numbness or weakness or chest pain.   Anxiety Patient is coming in for recheck of anxiety, patient is currently only using when necessary Xanax and does not use it every day and wants to continue like this. She does not want to try any other medications at this point. She denies any suicidal ideations or feelings of depression or thoughts of hurting herself.  Patient is postmenopausal and needs a DEXA scan  Relevant past medical, surgical, family and social history reviewed and updated as indicated. Interim medical history since our last visit reviewed. Allergies and medications reviewed and updated.  Review of Systems  Constitutional: Negative for chills and fever.  HENT: Negative for congestion, ear discharge and ear pain.   Eyes: Negative for redness and visual disturbance.  Respiratory: Negative for chest tightness and shortness of breath.   Cardiovascular: Negative for chest pain and leg swelling.  Genitourinary: Negative for difficulty urinating and dysuria.  Musculoskeletal: Negative for back pain and gait problem.  Skin:  Negative for rash.  Neurological: Negative for dizziness, light-headedness and headaches.  Psychiatric/Behavioral: Negative for agitation, behavioral problems, decreased concentration, dysphoric mood, self-injury, sleep disturbance and suicidal ideas. The patient is nervous/anxious.   All other systems reviewed and are negative.   Per HPI unless specifically indicated above        Objective:    BP 120/73   Pulse 81   Temp 98.6 F (37 C) (Oral)   Ht 5' 3"  (1.6 m)   Wt 151 lb 6 oz (68.7 kg)   BMI 26.81 kg/m   Wt Readings from Last 3 Encounters:  12/27/16 151 lb 6 oz (68.7 kg)  05/19/15 156 lb 9.6 oz (71 kg)  07/17/14 162 lb (73.5 kg)    Physical Exam  Constitutional: She is oriented to person, place, and time. She appears well-developed and well-nourished. No distress.  Eyes: Conjunctivae are normal.  Neck: Neck supple. No thyromegaly present.  Cardiovascular: Normal rate, regular rhythm, normal heart sounds and intact distal pulses.   No murmur heard. Pulmonary/Chest: Effort normal and breath sounds normal. No respiratory distress. She has no wheezes.  Musculoskeletal: Normal range of motion. She exhibits no edema.  Lymphadenopathy:    She has no cervical adenopathy.  Neurological: She is alert and oriented to person, place, and time. Coordination normal.  Skin: Skin is warm and dry. No rash noted. She is not diaphoretic.  Psychiatric: Her behavior is normal. Judgment normal. Her mood appears anxious. She expresses no suicidal ideation. She expresses no suicidal plans.  Nursing note and vitals reviewed.  Assessment & Plan:   Problem List Items Addressed This Visit      Cardiovascular and Mediastinum   Hypertension - Primary   Relevant Orders   CMP14+EGFR     Other   Hyperlipidemia   Relevant Orders   Lipid panel   GAD (generalized anxiety disorder)   Relevant Orders   CBC with Differential/Platelet   TSH    Other Visit Diagnoses    Postmenopausal        Relevant Orders   DG Bone Density   Need for hepatitis C screening test       Relevant Orders   Hepatitis C antibody       Follow up plan: Return in about 6 months (around 06/28/2017), or if symptoms worsen or fail to improve, for Recheck cholesterol and blood pressure.  Counseling provided for all of the vaccine components Orders Placed This Encounter  Procedures  . DG Bone Density  . CMP14+EGFR  . CBC with Differential/Platelet  . Lipid panel  . TSH  . Hepatitis C antibody    Caryl Pina, MD [facility] 12/27/2016, 4:31 PM

## 2016-12-28 LAB — CMP14+EGFR
ALBUMIN: 4.3 g/dL (ref 3.6–4.8)
ALT: 13 IU/L (ref 0–32)
AST: 21 IU/L (ref 0–40)
Albumin/Globulin Ratio: 1.5 (ref 1.2–2.2)
Alkaline Phosphatase: 80 IU/L (ref 39–117)
BILIRUBIN TOTAL: 0.3 mg/dL (ref 0.0–1.2)
BUN / CREAT RATIO: 20 (ref 12–28)
BUN: 18 mg/dL (ref 8–27)
CO2: 25 mmol/L (ref 20–29)
CREATININE: 0.91 mg/dL (ref 0.57–1.00)
Calcium: 9.5 mg/dL (ref 8.7–10.3)
Chloride: 99 mmol/L (ref 96–106)
GFR calc Af Amer: 76 mL/min/{1.73_m2} (ref >59)
GFR calc non Af Amer: 65 mL/min/{1.73_m2} (ref >59)
GLOBULIN, TOTAL: 2.8 g/dL (ref 1.5–4.5)
Glucose: 77 mg/dL (ref 65–99)
Potassium: 4.1 mmol/L (ref 3.5–5.2)
SODIUM: 139 mmol/L (ref 134–144)
Total Protein: 7.1 g/dL (ref 6.0–8.5)

## 2016-12-28 LAB — CBC WITH DIFFERENTIAL/PLATELET
Basophils Absolute: 0 10*3/uL (ref 0.0–0.2)
Basos: 0 %
EOS (ABSOLUTE): 0.4 10*3/uL (ref 0.0–0.4)
EOS: 4 %
HEMATOCRIT: 38.2 % (ref 34.0–46.6)
Hemoglobin: 13.3 g/dL (ref 11.1–15.9)
Immature Grans (Abs): 0 10*3/uL (ref 0.0–0.1)
Immature Granulocytes: 0 %
LYMPHS ABS: 3.4 10*3/uL — AB (ref 0.7–3.1)
Lymphs: 37 %
MCH: 29.8 pg (ref 26.6–33.0)
MCHC: 34.8 g/dL (ref 31.5–35.7)
MCV: 86 fL (ref 79–97)
MONOS ABS: 0.6 10*3/uL (ref 0.1–0.9)
Monocytes: 7 %
Neutrophils Absolute: 4.6 10*3/uL (ref 1.4–7.0)
Neutrophils: 52 %
Platelets: 258 10*3/uL (ref 150–379)
RBC: 4.46 x10E6/uL (ref 3.77–5.28)
RDW: 13.5 % (ref 12.3–15.4)
WBC: 9 10*3/uL (ref 3.4–10.8)

## 2016-12-28 LAB — LIPID PANEL
CHOL/HDL RATIO: 4.1 ratio (ref 0.0–4.4)
Cholesterol, Total: 202 mg/dL — ABNORMAL HIGH (ref 100–199)
HDL: 49 mg/dL (ref >39)
LDL Calculated: 128 mg/dL — ABNORMAL HIGH (ref 0–99)
TRIGLYCERIDES: 127 mg/dL (ref 0–149)
VLDL Cholesterol Cal: 25 mg/dL (ref 5–40)

## 2016-12-28 LAB — TSH: TSH: 1.43 u[IU]/mL (ref 0.450–4.500)

## 2016-12-28 LAB — HEPATITIS C ANTIBODY: Hep C Virus Ab: <0.1 s/co ratio (ref 0.0–0.9)

## 2016-12-29 ENCOUNTER — Other Ambulatory Visit: Payer: Self-pay | Admitting: Nurse Practitioner

## 2016-12-29 ENCOUNTER — Telehealth: Payer: Self-pay | Admitting: Family Medicine

## 2016-12-29 MED ORDER — LISINOPRIL-HYDROCHLOROTHIAZIDE 20-12.5 MG PO TABS: 1.0000 | ORAL_TABLET | Freq: Every day | ORAL | 1 refills | Status: DC

## 2016-12-29 NOTE — Telephone Encounter (Signed)
Pt aware refill sent to pharmacy 

## 2017-01-05 ENCOUNTER — Ambulatory Visit (INDEPENDENT_AMBULATORY_CARE_PROVIDER_SITE_OTHER): Payer: Medicare Other | Admitting: Pharmacist

## 2017-01-05 ENCOUNTER — Encounter: Payer: Self-pay | Admitting: Pharmacist

## 2017-01-05 DIAGNOSIS — M8589 Other specified disorders of bone density and structure, multiple sites: Secondary | ICD-10-CM | POA: Diagnosis not present

## 2017-01-05 NOTE — Patient Instructions (Signed)
Increase dietary calcium - try to drink 3 glasses of low fat milk  Calcium & Vitamin D: The Facts  Why is calcium and vitamin D consumption important? Calcium: . Most Americans do not consume adequate amounts of calcium! Calcium is required for proper muscle function, nerve communication, bone support, and many other functions in the body.  . The body uses bones as a source of calcium. Bones 'remodel' themselves continuously - the body constantly breaks bone down to release calcium and rebuilds bones by replacing calcium in the bone later.  . As we get older, the rate of bone breakdown occurs faster than bone rebuilding which could lead to osteopenia, osteoporosis, and possible fractures.   Vitamin D: . People naturally make vitamin D in the body when sunlight hits the skin and triggers a process that leads to vitamin D production. This natural vitamin D production requires about 10-15 minutes of sun exposure on the hands, arms, and face at least 2-3 times per week. However, due to decreased sun exposure and the use of sunscreen, most people will need to get additional vitamin D from foods or supplements. Your doctor can measure your body's vitamin D level through a simple blood test to determine your daily vitamin D needs.  . Vitamin D is used to help the body absorb calcium, maintain bone health, help the immune system, and reduce inflammation. It also plays a role in muscle performance, balance and risk of falling.  . Vitamin D deficiency can lead to osteomalacia or softening of the bones, bone pain, and muscle weakness.   The recommended daily allowance of Calcium and Vitamin D varies for different age groups. Age group Calcium (mg) Vitamin D (IU)  Females and Males: Age 76-50 1000 mg 600 IU  Females: Age 83- 78 1200 mg 600 IU  Males: Age 74-70 1000 mg 600 IU  Females and Males: Age 84+ 1200 mg 800 IU  Pregnant/lactating Females age 46-50 1000 mg 600 IU   How much Calcium do you get in  your diet? Calcium Intake # of servings per day  Total calcium (mg)  Skim milk, 2% milk (1 cup) _________ x 300 mg   Yogurt (1 small container) _________ x 200 mg   Cheese (1oz) _________ x 200 mg   Cottage Cheese (1 cup)             ________ x 150 mg   Almond milk (1 cup) _________ x 450 mg   Fortified Orange Juice (1 cup) _________ x 300 mg   Broccoli or spinach ( 1 cup) _________ x 100 mg   Salmon (3 oz) _________ x 150 mg    Almonds (1/4 cup) _______ x 90 mg      How do we get Calcium and Vitamin D in our diet? Calcium: . Obtaining calcium from the diet is the most preferred way to reach the recommended daily goal. If this goal is not reached through diet, calcium supplements are available.  . Calcium is found in many foods including: dairy products, dark leafy vegetables (like broccoli, kale, and spinach), fish, and fortified products like juices and cereals.  . The food label will have a %DV (percent daily value) listed showing the amount of calcium per serving. To determine the total mg per serving, simply replace the % with zero (0).  For example, Almond Breeze almond milk contains 45% DV of calcium or 41m per 1 cup.  . You can increase the amount of calcium in your diet  by using more calcium products in your daily meals. Use yogurt and fruit to make smoothies or use yogurt to top baked potatoes or make whipped potatoes. Sprinkle low fat cheese onto salads or into egg white omelets. You can even add non-fat dry milk powder (313m calcium per 1/3 cup) to hot cereals, meat loaf, soups, or potatoes.  . Calcium supplements come in many forms including tablets, chewables, and gummies. Be sure to read the label to determine the correct number of tablets per serving and whether or not to take the supplement with food.  . Calcium carbonate products (Oscal, Caltrate, and Viactiv) are generally better absorbed when taken with food while calcium citrate products like Citracal can be taken with  or without food.  . The body can only absorb about 600 mg of calcium at one time. It is recommended to take calcium supplements in small amounts several times per day.  However, taking it all at once is better than not taking it at all. . Increasing your intake of calcium is essential for bone health, but may also lead to some side effects like constipation, increased gas, bloating or abdominal cramping. To help reduce these side effects, start with 1 tablet per day and slowly increase your intake of the supplement to the recommended doses. It is also recommended that you drink plenty of water each day. Vitamin D: . Very few foods naturally contain vitamin D. However, it is found in saltwater fish (like tuna, salmon and mackerel), beef liver, egg yolks, cheese and vitamin D fortified foods (like yogurt, cereals, orange juice and milk) . The amount of vitamin D in each food or product is listed as %DV on the product label. To determine the total amount of vitamin D per serving, drop the % sign and multiply the number by 4. For example, 1 cup of Almond Breeze almond milk contains 25% DV vitamin D or 100 IU per serving (25 x 4 =100). . Vitamin D is also found in multivitamins and supplements and may be listed as ergocalciferol (vitamin D2) or cholecalciferol (vitamin D3). Each of these forms of vitamin D are equivalent and the daily recommended intake will vary based on your age and the vitamin D levels in your body. Follow your doctor's recommendation for vitamin D intake.                   Exercise for Strong Bones  Exercise is important to build and maintain strong bones / bone density.  There are 2 types of exercises that are important to building and maintaining strong bones:  Weight- bearing and muscle-stregthening.  Weight-bearing Exercises  These exercises include activities that make you move against gravity while staying upright. Weight-bearing exercises can be high-impact or  low-impact.  High-impact weight-bearing exercises help build bones and keep them strong. If you have broken a bone due to osteoporosis or are at risk of breaking a bone, you may need to avoid high-impact exercises. If you're not sure, you should check with your healthcare provider.  Examples of high-impact weight-bearing exercises are: Dancing  Doing high-impact aerobics  Hiking  Jogging/running  Jumping Rope  Stair climbing  Tennis  Low-impact weight-bearing exercises can also help keep bones strong and are a safe alternative if you cannot do high-impact exercises.   Examples of low-impact weight-bearing exercises are: Using elliptical training machines  Doing low-impact aerobics  Using stair-step machines  Fast walking on a treadmill or outside   Muscle-Strengthening Exercises These exercises  include activities where you move your body, a weight or some other resistance against gravity. They are also known as resistance exercises and include: Lifting weights  Using elastic exercise bands  Using weight machines  Lifting your own body weight  Functional movements, such as standing and rising up on your toes  Yoga and Pilates can also improve strength, balance and flexibility. However, certain positions may not be safe for people with osteoporosis or those at increased risk of broken bones. For example, exercises that have you bend forward may increase the chance of breaking a bone in the spine.   Non-Impact Exercises There are other types of exercises that can help prevent falls.  Non-impact exercises can help you to improve balance, posture and how well you move in everyday activities. Some of these exercises include: Balance exercises that strengthen your legs and test your balance, such as Tai Chi, can decrease your risk of falls.  Posture exercises that improve your posture and reduce rounded or "sloping" shoulders can help you decrease the chance of breaking a bone, especially  in the spine.  Functional exercises that improve how well you move can help you with everyday activities and decrease your chance of falling and breaking a bone. For example, if you have trouble getting up from a chair or climbing stairs, you should do these activities as exercises.   **A physical therapist can teach you balance, posture and functional exercises. He/she can also help you learn which exercises are safe and appropriate for you.  [facility] has a physical therapy office in [place] in front of our office and referrals can be made for assessments and treatment as needed and strength and balance training.  If you would like to have an assessment with [facility] and our physical therapy team please let a nurse or provider know.   Fall Prevention in the Home Falls can cause injuries and can affect people from all age groups. There are many simple things that you can do to make your home safe and to help prevent falls. What can I do on the outside of my home?  Regularly repair the edges of walkways and driveways and fix any cracks.  Remove high doorway thresholds.  Trim any shrubbery on the main path into your home.  Use bright outdoor lighting.  Clear walkways of debris and clutter, including tools and rocks.  Regularly check that handrails are securely fastened and in good repair. Both sides of any steps should have handrails.  Install guardrails along the edges of any raised decks or porches.  Have leaves, snow, and ice cleared regularly.  Use sand or salt on walkways during winter months.  In the garage, clean up any spills right away, including grease or oil spills. What can I do in the bathroom?  Use night lights.  Install grab bars by the toilet and in the tub and shower. Do not use towel bars as grab bars.  Use non-skid mats or decals on the floor of the tub or shower.  If you need to sit down while you are in the shower, use a plastic, non-slip stool.  Keep the  floor dry. Immediately clean up any water that spills on the floor.  Remove soap buildup in the tub or shower on a regular basis.  Attach bath mats securely with double-sided non-slip rug tape.  Remove throw rugs and other tripping hazards from the floor. What can I do in the bedroom?  Use night lights.  Make  sure that a bedside light is easy to reach.  Do not use oversized bedding that drapes onto the floor.  Have a firm chair that has side arms to use for getting dressed.  Remove throw rugs and other tripping hazards from the floor. What can I do in the kitchen?  Clean up any spills right away.  Avoid walking on wet floors.  Place frequently used items in easy-to-reach places.  If you need to reach for something above you, use a sturdy step stool that has a grab bar.  Keep electrical cables out of the way.  Do not use floor polish or wax that makes floors slippery. If you have to use wax, make sure that it is non-skid floor wax.  Remove throw rugs and other tripping hazards from the floor. What can I do in the stairways?  Do not leave any items on the stairs.  Make sure that there are handrails on both sides of the stairs. Fix handrails that are broken or loose. Make sure that handrails are as long as the stairways.  Check any carpeting to make sure that it is firmly attached to the stairs. Fix any carpet that is loose or worn.  Avoid having throw rugs at the top or bottom of stairways, or secure the rugs with carpet tape to prevent them from moving.  Make sure that you have a light switch at the top of the stairs and the bottom of the stairs. If you do not have them, have them installed. What are some other fall prevention tips?  Wear closed-toe shoes that fit well and support your feet. Wear shoes that have rubber soles or low heels.  When you use a stepladder, make sure that it is completely opened and that the sides are firmly locked. Have someone hold the ladder  while you are using it. Do not climb a closed stepladder.  Add color or contrast paint or tape to grab bars and handrails in your home. Place contrasting color strips on the first and last steps.  Use mobility aids as needed, such as canes, walkers, scooters, and crutches.  Turn on lights if it is dark. Replace any light bulbs that burn out.  Set up furniture so that there are clear paths. Keep the furniture in the same spot.  Fix any uneven floor surfaces.  Choose a carpet design that does not hide the edge of steps of a stairway.  Be aware of any and all pets.  Review your medicines with your healthcare provider. Some medicines can cause dizziness or changes in blood pressure, which increase your risk of falling. Talk with your health care provider about other ways that you can decrease your risk of falls. This may include working with a physical therapist or trainer to improve your strength, balance, and endurance. This information is not intended to replace advice given to you by your health care provider. Make sure you discuss any questions you have with your health care provider. Document Released: 06/18/2002 Document Revised: 11/25/2015 Document Reviewed: 08/02/2014 Elsevier Interactive Patient Education  2017 [employer].

## 2017-01-05 NOTE — Progress Notes (Signed)
Patient ID: Joy Bender, female   DOB: 1951/02/27, 66 y.o.   MRN: [id number]     HPI: Patient is referred by her PCP Dr Dettinger to review DEXA results and discuss treatment recommendations.   Back Pain?  No       Kyphosis?  No Prior fracture?  No                                                            PMH: Age at menopause:  66 yo Hysterectomy?  No Oophorectomy?  No HRT? No Steroid Use?  No Thyroid med?  No History of cancer?  No History of digestive disorders (ie Crohn's)?  No Current or previous eating disorders?  No Last Vitamin D Result:  Never checked Last GFR Result:  63 (12/27/2016)   FH/SH: Family history of osteoporosis?  No Parent with history of hip fracture?  No Family history of breast cancer?  No Exercise?  No - active at home and with grandaughter Smoking?  No Alcohol?  No    Calcium Assessment Calcium Intake  # of servings/day  Calcium mg  Milk (8 oz) 1  x  300  = 300mg   Yogurt (4 oz) 0 x  200 = 0  Cheese (1 oz) 1 x  200 = 200mg   Other Calcium sources   250mg   Ca supplement none = 0   Estimated calcium intake per day 750mg     DEXA Results Date of Test T-Score for AP Spine L1-L4 T-Score for Total Right Hip  12/27/2016 -1.7 -2.4               FRAX 10 year estimate: Total FX risk:  11.9%  (consider medication if >/= 20%) Hip FX risk:  2.1%  (consider medication if >/= 3%)  Assessment: Osteopenia with 10 year risk of fracture 11.9% total and 2.1% hip  Recommendations: 1.   Discussed BMD  / DEXA results and discussed fracture risk. 2.  recommend calcium 1200mg  daily through supplementation or diet.  3.  recommend weight bearing exercise - 30 minutes at least 4 days per week.   4.  Counseled and educated about fall risk and prevention. 5. Checked vitamin D level today  Recheck DEXA:  2 years  Time spent counseling patient:  25 minutes

## 2017-01-06 LAB — VITAMIN D 25 HYDROXY (VIT D DEFICIENCY, FRACTURES): Vit D, 25-Hydroxy: 28.4 ng/mL — ABNORMAL LOW (ref 30.0–100.0)

## 2017-01-19 ENCOUNTER — Telehealth: Payer: Self-pay | Admitting: Family Medicine

## 2017-01-19 NOTE — Telephone Encounter (Signed)
Denied.

## 2017-05-05 ENCOUNTER — Other Ambulatory Visit: Payer: Self-pay

## 2017-05-05 NOTE — Patient Outreach (Signed)
[facility]) Care Management  05/05/2017  [name] 10-Dec-1950 [id number]   Medication Adherence call to Mrs. Joy Bender the reason for this call is because Joy Bender is showing past due under [facility]on lisinopril/hctz 20/12.5 spoke to patient she said she still has a few but will  need more, she ask if we can contact [facility] and order her medication we call [facility] and ask if this medication has refill and if they can fill the medication for her [facility] ready for patient to pick up .call patient back to let her know that [facility] will have her medication ready for her   Joy AbedAna Bender CPhT Pharmacy Technician [facility] Care Management Direct Dial [phone]  Fax [phone] [email or web address]

## 2017-06-09 ENCOUNTER — Other Ambulatory Visit: Payer: Self-pay | Admitting: Family Medicine

## 2017-06-16 ENCOUNTER — Other Ambulatory Visit: Payer: Self-pay | Admitting: Family Medicine

## 2017-07-27 ENCOUNTER — Other Ambulatory Visit: Payer: Self-pay | Admitting: *Deleted

## 2017-07-27 MED ORDER — LISINOPRIL-HYDROCHLOROTHIAZIDE 20-12.5 MG PO TABS
1.0000 | ORAL_TABLET | Freq: Every day | ORAL | 0 refills | Status: DC
Start: 2017-07-27 — End: 2018-04-28

## 2017-08-08 ENCOUNTER — Telehealth: Payer: Self-pay | Admitting: Family Medicine

## 2018-01-03 ENCOUNTER — Other Ambulatory Visit: Payer: Self-pay | Admitting: Family Medicine

## 2018-01-26 ENCOUNTER — Other Ambulatory Visit: Payer: Self-pay

## 2018-01-26 NOTE — Patient Outreach (Signed)
[facility]) Care Management  01/26/2018  Susa RaringDarlene C Bender [birth year] [id number]   Medication Adherence call to Joy Bender spoke with patient she is still taking Lisinopril / Hctz 20/12.5 but patient said there are days she forgets to take her medication patient still has medications until the end of the month, ask patient if she wants the pharmacist to give her a call patient refuse.she said this was the only medication she takes and was able to care for medication.   Lillia AbedAna Bender CPhT Pharmacy Technician [facility] Care Management Direct Dial [phone]  Fax [phone] [email or web address]

## 2018-02-09 ENCOUNTER — Telehealth: Payer: Self-pay | Admitting: Family Medicine

## 2018-02-10 DIAGNOSIS — H2513 Age-related nuclear cataract, bilateral: Secondary | ICD-10-CM | POA: Diagnosis not present

## 2018-02-10 DIAGNOSIS — H40033 Anatomical narrow angle, bilateral: Secondary | ICD-10-CM | POA: Diagnosis not present

## 2018-02-14 ENCOUNTER — Other Ambulatory Visit: Payer: Self-pay

## 2018-02-14 NOTE — Patient Outreach (Signed)
[facility]) Care Management  02/14/2018  Joy Bender [birth year] [id number]   Medication Adherence call to Joy Bender patient husband ask if we can call back patient is due on Lisinopril/Hctz 20/12 :5 mg. Joy Bender is showing past due under [facility].   Joy AbedAna Ollison-Moran CPhT Pharmacy Technician [facility] Direct Dial [phone]  Fax [phone] [name]

## 2018-04-28 ENCOUNTER — Other Ambulatory Visit: Payer: Self-pay | Admitting: Family Medicine

## 2018-04-28 NOTE — Telephone Encounter (Signed)
Last seen 12/27/16  1 pill left

## 2018-05-31 ENCOUNTER — Other Ambulatory Visit: Payer: Self-pay | Admitting: Family Medicine

## 2018-05-31 NOTE — Telephone Encounter (Signed)
Patient has been notified multiple times that she needs appt

## 2018-05-31 NOTE — Telephone Encounter (Signed)
Last seen 12/27/16  Dr Dettinger  Needs to be seen

## 2018-06-16 ENCOUNTER — Encounter: Payer: Self-pay | Admitting: Family Medicine

## 2018-06-16 ENCOUNTER — Ambulatory Visit (INDEPENDENT_AMBULATORY_CARE_PROVIDER_SITE_OTHER): Payer: Medicare Other | Admitting: Family Medicine

## 2018-06-16 VITALS — BP 121/77 | HR 73 | Temp 97.0°F | Ht 63.0 in | Wt 142.6 lb

## 2018-06-16 DIAGNOSIS — E782 Mixed hyperlipidemia: Secondary | ICD-10-CM

## 2018-06-16 DIAGNOSIS — I10 Essential (primary) hypertension: Secondary | ICD-10-CM

## 2018-06-16 MED ORDER — LISINOPRIL-HYDROCHLOROTHIAZIDE 20-12.5 MG PO TABS: 1.0000 | ORAL_TABLET | Freq: Every day | ORAL | 3 refills | Status: DC

## 2018-06-16 NOTE — Progress Notes (Signed)
BP 121/77   Pulse 73   Temp (!) 97 F (36.1 C) (Oral)   Ht 5' 3"  (1.6 m)   Wt 142 lb 9.6 oz (64.7 kg)   BMI 25.26 kg/m    Subjective:    Patient ID: Joy Bender, female    DOB: 24-Feb-1951, 67 y.o.   MRN: [id number]  HPI: Joy Bender is a 67 y.o. female presenting on 06/16/2018 for Hyperlipidemia (Check up of chronic medical conditions) and Hypertension   HPI Hypertension Patient is currently on lisinopril-hydrochlorothiazide, and their blood pressure today is 121/77. Patient denies any lightheadedness or dizziness. Patient denies headaches, blurred vision, chest pains, shortness of breath, or weakness. Denies any side effects from medication and is content with current medication.   Hyperlipidemia Patient is coming in for recheck of his hyperlipidemia. The patient is currently taking Lipitor but she stopped taking it about 5 or 6 months ago and we will check where her labs are. They deny any issues with myalgias or history of liver damage from it. They deny any focal numbness or weakness or chest pain.   Relevant past medical, surgical, family and social history reviewed and updated as indicated. Interim medical history since our last visit reviewed. Allergies and medications reviewed and updated.  Review of Systems  Constitutional: Negative for chills and fever.  Eyes: Negative for visual disturbance.  Respiratory: Negative for chest tightness and shortness of breath.   Cardiovascular: Negative for chest pain and leg swelling.  Genitourinary: Negative for difficulty urinating and dysuria.  Musculoskeletal: Negative for back pain and gait problem.  Skin: Negative for rash.  Neurological: Negative for dizziness, light-headedness and headaches.  Psychiatric/Behavioral: Negative for agitation and behavioral problems.  All other systems reviewed and are negative.   Per HPI unless specifically indicated above   Allergies as of 06/16/2018   No Known Allergies       Medication List        Accurate as of 06/16/18  9:20 AM. Always use your most recent med list.          aspirin 81 MG tablet Take 81 mg by mouth daily.   lisinopril-hydrochlorothiazide 20-12.5 MG tablet Commonly known as:  PRINZIDE,ZESTORETIC Take 1 tablet by mouth daily.          Objective:    BP 121/77   Pulse 73   Temp (!) 97 F (36.1 C) (Oral)   Ht 5' 3"  (1.6 m)   Wt 142 lb 9.6 oz (64.7 kg)   BMI 25.26 kg/m   Wt Readings from Last 3 Encounters:  06/16/18 142 lb 9.6 oz (64.7 kg)  12/27/16 151 lb 6 oz (68.7 kg)  05/19/15 156 lb 9.6 oz (71 kg)    Physical Exam  Constitutional: She is oriented to person, place, and time. She appears well-developed and well-nourished. No distress.  Eyes: Conjunctivae are normal.  Neck: Neck supple. No thyromegaly present.  Cardiovascular: Normal rate, regular rhythm, normal heart sounds and intact distal pulses.  No murmur heard. Pulmonary/Chest: Effort normal and breath sounds normal. No respiratory distress. She has no wheezes.  Musculoskeletal: Normal range of motion. She exhibits no edema or tenderness.  Lymphadenopathy:    She has no cervical adenopathy.  Neurological: She is alert and oriented to person, place, and time. Coordination normal.  Skin: Skin is warm and dry. No rash noted. She is not diaphoretic.  Psychiatric: She has a normal mood and affect. Her behavior is normal.  Nursing note and vitals  reviewed.       Assessment & Plan:   Problem List Items Addressed This Visit      Cardiovascular and Mediastinum   Hypertension - Primary   Relevant Medications   lisinopril-hydrochlorothiazide (PRINZIDE,ZESTORETIC) 20-12.5 MG tablet   Other Relevant Orders   CMP14+EGFR     Other   Hyperlipidemia   Relevant Medications   lisinopril-hydrochlorothiazide (PRINZIDE,ZESTORETIC) 20-12.5 MG tablet   Other Relevant Orders   Lipid panel       Follow up plan: Return in about 6 months (around 12/16/2018), or if  symptoms worsen or fail to improve, for Hypertension and cholesterol.  Counseling provided for all of the vaccine components Orders Placed This Encounter  Procedures  . CMP14+EGFR  . Lipid panel    Caryl Pina, MD [facility] 06/16/2018, 9:20 AM

## 2018-06-17 LAB — LIPID PANEL
Chol/HDL Ratio: 4 ratio (ref 0.0–4.4)
Cholesterol, Total: 199 mg/dL (ref 100–199)
HDL: 50 mg/dL (ref >39)
LDL Calculated: 124 mg/dL — ABNORMAL HIGH (ref 0–99)
TRIGLYCERIDES: 126 mg/dL (ref 0–149)
VLDL CHOLESTEROL CAL: 25 mg/dL (ref 5–40)

## 2018-06-17 LAB — CMP14+EGFR
A/G RATIO: 1.5 (ref 1.2–2.2)
ALT: 11 IU/L (ref 0–32)
AST: 16 IU/L (ref 0–40)
Albumin: 4.3 g/dL (ref 3.6–4.8)
Alkaline Phosphatase: 93 IU/L (ref 39–117)
BILIRUBIN TOTAL: 0.4 mg/dL (ref 0.0–1.2)
BUN/Creatinine Ratio: 16 (ref 12–28)
BUN: 15 mg/dL (ref 8–27)
CALCIUM: 9.7 mg/dL (ref 8.7–10.3)
CHLORIDE: 102 mmol/L (ref 96–106)
CO2: 21 mmol/L (ref 20–29)
Creatinine, Ser: 0.95 mg/dL (ref 0.57–1.00)
GFR, EST AFRICAN AMERICAN: 71 mL/min/{1.73_m2} (ref >59)
GFR, EST NON AFRICAN AMERICAN: 61 mL/min/{1.73_m2} (ref >59)
Globulin, Total: 2.9 g/dL (ref 1.5–4.5)
Glucose: 92 mg/dL (ref 65–99)
POTASSIUM: 4.1 mmol/L (ref 3.5–5.2)
Sodium: 145 mmol/L — ABNORMAL HIGH (ref 134–144)
TOTAL PROTEIN: 7.2 g/dL (ref 6.0–8.5)

## 2018-09-06 ENCOUNTER — Telehealth: Payer: Self-pay | Admitting: Family Medicine

## 2018-09-06 NOTE — Telephone Encounter (Signed)
LM due for AWV °

## 2019-01-01 ENCOUNTER — Telehealth: Payer: Self-pay | Admitting: Family Medicine

## 2019-01-20 DIAGNOSIS — H40033 Anatomical narrow angle, bilateral: Secondary | ICD-10-CM | POA: Diagnosis not present

## 2019-01-20 DIAGNOSIS — H2513 Age-related nuclear cataract, bilateral: Secondary | ICD-10-CM | POA: Diagnosis not present

## 2019-05-11 ENCOUNTER — Ambulatory Visit: Payer: Medicare Other | Admitting: Family Medicine

## 2019-05-28 ENCOUNTER — Other Ambulatory Visit: Payer: Self-pay

## 2019-05-28 NOTE — Patient Outreach (Signed)
[facility]) Care Management  05/28/2019  Joy Bender July 13, 1950 [id number]   Medication Adherence call to [name] spoke with patient she is past due on Lisinopril/Hctz 20/12.5 mg,patient explain she is only taking it as needed at this time and has plenty.[name] is showing past due under [place].   [facility] Management Direct Dial [phone]  Fax [phone] [email or web address]

## 2019-07-14 ENCOUNTER — Other Ambulatory Visit: Payer: Self-pay | Admitting: Family Medicine

## 2019-07-14 DIAGNOSIS — I10 Essential (primary) hypertension: Secondary | ICD-10-CM

## 2019-07-30 ENCOUNTER — Encounter: Payer: Self-pay | Admitting: Physician Assistant

## 2019-07-30 ENCOUNTER — Other Ambulatory Visit: Payer: Self-pay

## 2019-07-30 ENCOUNTER — Ambulatory Visit (INDEPENDENT_AMBULATORY_CARE_PROVIDER_SITE_OTHER): Payer: Medicare Other | Admitting: Physician Assistant

## 2019-07-30 VITALS — BP 149/84 | HR 75 | Temp 98.6°F | Resp 20 | Ht 63.0 in | Wt 132.0 lb

## 2019-07-30 DIAGNOSIS — N814 Uterovaginal prolapse, unspecified: Secondary | ICD-10-CM | POA: Diagnosis not present

## 2019-07-30 NOTE — Patient Instructions (Signed)
Pelvic Organ Prolapse Pelvic organ prolapse is the stretching, bulging, or dropping of pelvic organs into an abnormal position. It happens when the muscles and tissues that surround and support pelvic structures become weak or stretched. Pelvic organ prolapse can involve the:  Vagina (vaginal prolapse).  Uterus (uterine prolapse).  Bladder (cystocele).  Rectum (rectocele).  Intestines (enterocele). When organs other than the vagina are involved, they often bulge into the vagina or protrude from the vagina, depending on how severe the prolapse is. What are the causes? This condition may be caused by:  Pregnancy, labor, and childbirth.  Past pelvic surgery.  Decreased production of the hormone estrogen associated with menopause.  Consistently lifting more than 50 lb (23 kg).  Obesity.  Long-term inability to pass stool (chronic constipation).  A cough that lasts a long time (chronic).  Buildup of fluid in the abdomen due to certain diseases and other conditions. What are the signs or symptoms? Symptoms of this condition include:  Passing a little urine (loss of bladder control) when you cough, sneeze, strain, and exercise (stress incontinence). This may be worse immediately after childbirth. It may gradually improve over time.  Feeling pressure in your pelvis or vagina. This pressure may increase when you cough or when you are passing stool.  A bulge that protrudes from the opening of your vagina.  Difficulty passing urine or stool.  Pain in your lower back.  Pain, discomfort, or disinterest in sex.  Repeated bladder infections (urinary tract infections).  Difficulty inserting a tampon. In some people, this condition causes no symptoms. How is this diagnosed? This condition may be diagnosed based on a vaginal and rectal exam. During the exam, you may be asked to cough and strain while you are lying down, sitting, and standing up. Your health care provider will  determine if other tests are required, such as bladder function tests. How is this treated? Treatment for this condition may depend on your symptoms. Treatment may include:  Lifestyle changes, such as changes to your diet.  Emptying your bladder at scheduled times (bladder training therapy). This can help reduce or avoid urinary incontinence.  Estrogen. Estrogen may help mild prolapse by increasing the strength and tone of pelvic floor muscles.  Kegel exercises. These may help mild cases of prolapse by strengthening and tightening the muscles of the pelvic floor.  A soft, flexible device that helps support the vaginal walls and keep pelvic organs in place (pessary). This is inserted into your vagina by your health care provider.  Surgery. This is often the only form of treatment for severe prolapse. Follow these instructions at home:  Avoid drinking beverages that contain caffeine or alcohol.  Increase your intake of high-fiber foods. This can help decrease constipation and straining during bowel movements.  Lose weight if recommended by your health care provider.  Wear a sanitary pad or adult diapers if you have urinary incontinence.  Avoid heavy lifting and straining with exercise and work. Do not hold your breath when you perform mild to moderate lifting and exercise activities. Limit your activities as directed by your health care provider.  Do Kegel exercises as directed by your health care provider. To do this: ? Squeeze your pelvic floor muscles tight. You should feel a tight lift in your rectal area and a tightness in your vaginal area. Keep your stomach, buttocks, and legs relaxed. ? Hold the muscles tight for up to 10 seconds. ? Relax your muscles. ? Repeat this exercise 50 times a day,   or as many times as told by your health care provider. Continue to do this exercise for at least 4-6 weeks, or for as long as told by your health care provider.  Take over-the-counter and  prescription medicines only as told by your health care provider.  If you have a pessary, take care of it as told by your health care provider.  Keep all follow-up visits as told by your health care provider. This is important. Contact a health care provider if you:  Have symptoms that interfere with your daily activities or sex life.  Need medicine to help with the discomfort.  Notice bleeding from your vagina that is not related to your period.  Have a fever.  Have pain or bleeding when you urinate.  Have bleeding when you pass stool.  Pass urine when you have sex.  Have chronic constipation.  Have a pessary that falls out.  Have bad smelling vaginal discharge.  Have an unusual, low pain in your abdomen. Summary  Pelvic organ prolapse is the stretching, bulging, or dropping of pelvic organs into an abnormal position. It happens when the muscles and tissues that surround and support pelvic structures become weak or stretched.  When organs other than the vagina are involved, they often bulge into the vagina or protrude from the vagina, depending on how severe the prolapse is.  In most cases, this condition needs to be treated only if it produces symptoms. Treatment may include lifestyle changes, estrogen, Kegel exercises, pessary insertion, or surgery.  Avoid heavy lifting and straining with exercise and work. Do not hold your breath when you perform mild to moderate lifting and exercise activities. Limit your activities as directed by your health care provider. This information is not intended to replace advice given to you by your health care provider. Make sure you discuss any questions you have with your health care provider. Document Revised: 07/20/2017 Document Reviewed: 07/20/2017 Elsevier Patient Education  2020 [employer]vier Inc[employer]  

## 2019-07-30 NOTE — Progress Notes (Signed)
Acute Office Visit  Subjective:    Patient ID: Joy Bender, female    DOB: 1951/01/30, 69 y.o.   MRN: [id number]  Chief Complaint  Patient presents with  . Groin Swelling    HPI Patient is in today for swelling in her vagina.  This patient is accompanied by her sister-in-law Ms. Cardwell.  She has been complaining for the last few days of swelling in her vaginal area.  She has never had any pelvic surgery.  It has been many years since she has had chronic pathology evaluation.  She denies any blood, urinary tract symptoms.  No fever or chills.  We have discussed that if there is some prolapse of her uterus it can affect other organs.  I have encouraged them to watch for signs of urinary tract infections.  We will plan to have an appointment with gynecology for soon as possible.  She is also having significant confusion and dementia symptoms.  Her sister-in-law said that it has been for several months now.  She does have an appointment with her PCP very soon and they can do a further work-up and lab work.  Her husband is [employer] Therapist, occupational and will be accompanying her to the gynecology appointment.  Past Medical History:  Diagnosis Date  . Atypical chest pain    Status post admission in June 2012 ruled out for myocardial infarction status post stress echocardiogram without evidence for ischemia.  . Hyperlipidemia    Lipid panel June 2012 cholesterol 214, triglycerides 144 HDL 40 LDL 145  . Hypertension   . Paroxysmal atrial fibrillation ([place])    per [employer] Monitor in 2009 only single episode of a brief run of atrial fibrillation noted on cardiac monitoring 2009 no clinical recurrence.   Marland Kitchen PVC's (premature ventricular contractions)     History reviewed. No pertinent surgical history.  Family History  Problem Relation Age of Onset  . Cancer Brother     Social History   Socioeconomic History  . Marital status: Married    Spouse name: Not on file  . Number of children: 3    . Years of education: Not on file  . Highest education level: Not on file  Occupational History  . Occupation: [employer]  Tobacco Use  . Smoking status: Never Smoker  . Smokeless tobacco: Never Used  Substance and Sexual Activity  . Alcohol use: No    Alcohol/week: 0.0 standard drinks  . Drug use: No  . Sexual activity: Not on file  Other Topics Concern  . Not on file  Social History Narrative  . Not on file   Social Determinants of Health   Financial Resource Strain:   . Difficulty of Paying Living Expenses: Not on file  Food Insecurity:   . Worried About Charity fundraiser in the Last Year: Not on file  . Ran Out of Food in the Last Year: Not on file  Transportation Needs:   . Lack of Transportation (Medical): Not on file  . Lack of Transportation (Non-Medical): Not on file  Physical Activity:   . Days of Exercise per Week: Not on file  . Minutes of Exercise per Session: Not on file  Stress:   . Feeling of Stress : Not on file  Social Connections:   . Frequency of Communication with Friends and Family: Not on file  . Frequency of Social Gatherings with Friends and Family: Not on file  . Attends Religious Services: Not on file  .  Active Member of Clubs or Organizations: Not on file  . Attends Banker Meetings: Not on file  . Marital Status: Not on file  Intimate Partner Violence:   . Fear of Current or Ex-Partner: Not on file  . Emotionally Abused: Not on file  . Physically Abused: Not on file  . Sexually Abused: Not on file    Outpatient Medications Prior to Visit  Medication Sig Dispense Refill  . lisinopril-hydrochlorothiazide (ZESTORETIC) 20-12.5 MG tablet Take 1 tablet by mouth once daily 90 tablet 0  . aspirin 81 MG tablet Take 81 mg by mouth daily.       No facility-administered medications prior to visit.    No Known Allergies  Review of Systems  Constitutional: Negative.  Negative for activity change, fatigue and fever.  HENT:  Negative.   Eyes: Negative.   Respiratory: Negative.  Negative for cough.   Cardiovascular: Negative.  Negative for chest pain.  Gastrointestinal: Negative.  Negative for abdominal pain.  Endocrine: Negative.   Genitourinary: Positive for vaginal discharge. Negative for dysuria, vaginal bleeding and vaginal pain.  Musculoskeletal: Negative.   Skin: Negative.   Neurological: Negative.   Psychiatric/Behavioral: Positive for confusion and decreased concentration. Negative for agitation and behavioral problems.       Objective:    Physical Exam Exam conducted with a chaperone present.  Constitutional:      General: She is not in acute distress.    Appearance: Normal appearance. She is well-developed.  HENT:     Head: Normocephalic and atraumatic.  Cardiovascular:     Rate and Rhythm: Normal rate.  Pulmonary:     Effort: Pulmonary effort is normal.  Genitourinary:    Urethra: Prolapse present.       Comments: Tight prolapse of uterus at the vaginal introitus.  No internal exam performed by me.  No Valsalva needed to see the prolapse. Skin:    General: Skin is warm and dry.     Findings: No rash.  Neurological:     Mental Status: She is alert and oriented to person, place, and time.     Deep Tendon Reflexes: Reflexes are normal and symmetric.     BP (!) 149/84   Pulse 75   Temp 98.6 F (37 C) (Temporal)   Resp 20   Ht 5\' 3"  (1.6 m)   Wt 132 lb (59.9 kg)   SpO2 97%   BMI 23.38 kg/m  Wt Readings from Last 3 Encounters:  07/30/19 132 lb (59.9 kg)  06/16/18 142 lb 9.6 oz (64.7 kg)  12/27/16 151 lb 6 oz (68.7 kg)    Health Maintenance Due  Topic Date Due  . TETANUS/TDAP  06/09/1968  . MAMMOGRAM  06/10/1999  . COLONOSCOPY  06/10/1999  . PNA vac Low Risk Adult (1 of 2 - PCV13) 06/09/2014  . DEXA SCAN  01/04/2019  . INFLUENZA VACCINE  02/10/2019    There are no preventive care reminders to display for this patient.   Lab Results  Component Value Date   TSH  1.430 12/27/2016   Lab Results  Component Value Date   WBC 9.0 12/27/2016   HGB 13.3 12/27/2016   HCT 38.2 12/27/2016   MCV 86 12/27/2016   PLT 258 12/27/2016   Lab Results  Component Value Date   NA 145 (H) 06/16/2018   K 4.1 06/16/2018   CO2 21 06/16/2018   GLUCOSE 92 06/16/2018   BUN 15 06/16/2018   CREATININE 0.95 06/16/2018   BILITOT  0.4 06/16/2018   ALKPHOS 93 06/16/2018   AST 16 06/16/2018   ALT 11 06/16/2018   PROT 7.2 06/16/2018   ALBUMIN 4.3 06/16/2018   CALCIUM 9.7 06/16/2018   Lab Results  Component Value Date   CHOL 199 06/16/2018   Lab Results  Component Value Date   HDL 50 06/16/2018   Lab Results  Component Value Date   LDLCALC 124 (H) 06/16/2018   Lab Results  Component Value Date   TRIG 126 06/16/2018   Lab Results  Component Value Date   CHOLHDL 4.0 06/16/2018   Lab Results  Component Value Date   HGBA1C 5.6 01/21/2011       Assessment & Plan:   Problem List Items Addressed This Visit    None    Visit Diagnoses    Prolapsed uterus    -  Primary   Relevant Orders   Ambulatory referral to Obstetrics / Gynecology       No orders of the defined types were placed in this encounter.    Remus Loffler, PA-C

## 2019-08-01 ENCOUNTER — Encounter: Payer: Self-pay | Admitting: Family Medicine

## 2019-08-01 ENCOUNTER — Ambulatory Visit (INDEPENDENT_AMBULATORY_CARE_PROVIDER_SITE_OTHER): Payer: Medicare Other | Admitting: Family Medicine

## 2019-08-01 ENCOUNTER — Other Ambulatory Visit: Payer: Self-pay

## 2019-08-01 VITALS — BP 120/71 | HR 98 | Temp 98.2°F | Ht 63.0 in | Wt 128.8 lb

## 2019-08-01 DIAGNOSIS — F411 Generalized anxiety disorder: Secondary | ICD-10-CM

## 2019-08-01 DIAGNOSIS — I10 Essential (primary) hypertension: Secondary | ICD-10-CM | POA: Diagnosis not present

## 2019-08-01 DIAGNOSIS — F02818 Dementia in other diseases classified elsewhere, unspecified severity, with other behavioral disturbance: Secondary | ICD-10-CM

## 2019-08-01 DIAGNOSIS — F0281 Dementia in other diseases classified elsewhere with behavioral disturbance: Secondary | ICD-10-CM | POA: Diagnosis not present

## 2019-08-01 DIAGNOSIS — G301 Alzheimer's disease with late onset: Secondary | ICD-10-CM | POA: Diagnosis not present

## 2019-08-01 MED ORDER — DONEPEZIL HCL 5 MG PO TABS: 5.0000 mg | ORAL_TABLET | Freq: Every day | ORAL | 3 refills | Status: DC

## 2019-08-01 MED ORDER — LISINOPRIL-HYDROCHLOROTHIAZIDE 20-12.5 MG PO TABS: 1.0000 | ORAL_TABLET | Freq: Every day | ORAL | 3 refills | Status: DC

## 2019-08-01 MED ORDER — SERTRALINE HCL 50 MG PO TABS: 50.0000 mg | ORAL_TABLET | Freq: Every day | ORAL | 3 refills | Status: DC

## 2019-08-01 NOTE — Progress Notes (Signed)
BP 120/71   Pulse 98   Temp 98.2 F (36.8 C) (Temporal)   Ht 5' 3"  (1.6 m)   Wt 128 lb 12.8 oz (58.4 kg)   SpO2 97%   BMI 22.82 kg/m    Subjective:   Patient ID: Joy Bender, female    DOB: 01/04/1951, 69 y.o.   MRN: [id number]  HPI: Joy Bender is a 69 y.o. female presenting on 08/01/2019 for Memory Loss   HPI Patient is having memory disorder Never been in today with both her sister-in-law and her husband states she has been having increased memory issues and has been worsening over the past year gradually but that has been progressively worsening.  She has been forgetting where the bathroom is or forgetting where other things are in the house that she always knew where they were but she is "forgetting some family members names at times as well.  She is also been more anxious because of that because she has been forgetting more frequently. MMSE - Mini Mental State Exam 08/01/2019  Orientation to time 0  Orientation to Place 3  Registration 3  Attention/ Calculation 4  Recall 0  Language- name 2 objects 2  Language- repeat 1  Language- follow 3 step command 3  Language- read & follow direction 1  Write a sentence 1  Copy design 0  Total score 18    Relevant past medical, surgical, family and social history reviewed and updated as indicated. Interim medical history since our last visit reviewed. Allergies and medications reviewed and updated.  Review of Systems  Constitutional: Negative for chills and fever.  Eyes: Negative for visual disturbance.  Respiratory: Negative for chest tightness and shortness of breath.   Cardiovascular: Negative for chest pain and leg swelling.  Musculoskeletal: Negative for back pain and gait problem.  Skin: Negative for rash.  Neurological: Negative for light-headedness and headaches.  Psychiatric/Behavioral: Positive for confusion. Negative for agitation, behavioral problems, dysphoric mood, self-injury, sleep disturbance and  suicidal ideas. The patient is nervous/anxious.   All other systems reviewed and are negative.   Per HPI unless specifically indicated above   Allergies as of 08/01/2019   No Known Allergies     Medication List       Accurate as of August 01, 2019 12:09 PM. If you have any questions, ask your nurse or doctor.        aspirin 81 MG tablet Take 81 mg by mouth daily.   donepezil 5 MG tablet Commonly known as: Aricept Take 1 tablet (5 mg total) by mouth at bedtime. Started by: Worthy Rancher, MD   lisinopril-hydrochlorothiazide 20-12.5 MG tablet Commonly known as: ZESTORETIC Take 1 tablet by mouth daily.   sertraline 50 MG tablet Commonly known as: Zoloft Take 1 tablet (50 mg total) by mouth daily. Started by: Fransisca Kaufmann Yalda Herd, MD        Objective:   BP 120/71   Pulse 98   Temp 98.2 F (36.8 C) (Temporal)   Ht 5' 3"  (1.6 m)   Wt 128 lb 12.8 oz (58.4 kg)   SpO2 97%   BMI 22.82 kg/m   Wt Readings from Last 3 Encounters:  08/01/19 128 lb 12.8 oz (58.4 kg)  07/30/19 132 lb (59.9 kg)  06/16/18 142 lb 9.6 oz (64.7 kg)    Physical Exam Vitals and nursing note reviewed.  Constitutional:      General: She is not in acute distress.    Appearance: She  is well-developed. She is not diaphoretic.  Eyes:     Conjunctiva/sclera: Conjunctivae normal.  Cardiovascular:     Rate and Rhythm: Normal rate and regular rhythm.     Heart sounds: Normal heart sounds. No murmur.  Pulmonary:     Effort: Pulmonary effort is normal. No respiratory distress.     Breath sounds: Normal breath sounds. No wheezing.  Musculoskeletal:        General: No tenderness. Normal range of motion.  Skin:    General: Skin is warm and dry.     Findings: No rash.  Neurological:     Mental Status: She is alert and oriented to person, place, and time.     Coordination: Coordination normal.  Psychiatric:        Mood and Affect: Mood is anxious. Mood is not depressed.        Behavior:  Behavior normal.        Thought Content: Thought content does not include suicidal ideation. Thought content does not include suicidal plan.        Cognition and Memory: Memory is impaired. She exhibits impaired recent memory.       Assessment & Plan:   Problem List Items Addressed This Visit      Cardiovascular and Mediastinum   Hypertension   Relevant Medications   lisinopril-hydrochlorothiazide (ZESTORETIC) 20-12.5 MG tablet   donepezil (ARICEPT) 5 MG tablet   sertraline (ZOLOFT) 50 MG tablet   Other Relevant Orders   CBC with Differential/Platelet   TSH   CMP14+EGFR   Vitamin B12     Other   GAD (generalized anxiety disorder) - Primary   Relevant Medications   donepezil (ARICEPT) 5 MG tablet   sertraline (ZOLOFT) 50 MG tablet   Other Relevant Orders   CBC with Differential/Platelet   TSH   CMP14+EGFR   Vitamin B12   RPR   Referral to Chronic Care Management Services    Other Visit Diagnoses    Late onset Alzheimer's disease with behavioral disturbance (HCC)       Relevant Medications   donepezil (ARICEPT) 5 MG tablet   sertraline (ZOLOFT) 50 MG tablet   Other Relevant Orders   CBC with Differential/Platelet   TSH   CMP14+EGFR   Vitamin B12   RPR   Referral to Chronic Care Management Services      Will start her on Aricept and Zoloft for mood and memory issues, will test her blood work today and continue her current blood pressure medication. Follow up plan: Return in about 4 weeks (around 08/29/2019), or if symptoms worsen or fail to improve, for Return in 4 to 8 weeks for recheck on memory and anxiety.  Counseling provided for all of the vaccine components Orders Placed This Encounter  Procedures  . CBC with Differential/Platelet  . TSH  . CMP14+EGFR  . Vitamin B12  . RPR  . Referral to Chronic Care Management Services    Caryl Pina, MD [facility] 08/01/2019, 12:09 PM

## 2019-08-02 LAB — CBC WITH DIFFERENTIAL/PLATELET
Basophils Absolute: 0 10*3/uL (ref 0.0–0.2)
Basos: 1 %
EOS (ABSOLUTE): 0.1 10*3/uL (ref 0.0–0.4)
Eos: 2 %
Hematocrit: 44.2 % (ref 34.0–46.6)
Hemoglobin: 15.4 g/dL (ref 11.1–15.9)
Immature Grans (Abs): 0 10*3/uL (ref 0.0–0.1)
Immature Granulocytes: 0 %
Lymphocytes Absolute: 2.6 10*3/uL (ref 0.7–3.1)
Lymphs: 32 %
MCH: 30.7 pg (ref 26.6–33.0)
MCHC: 34.8 g/dL (ref 31.5–35.7)
MCV: 88 fL (ref 79–97)
Monocytes Absolute: 0.6 10*3/uL (ref 0.1–0.9)
Monocytes: 8 %
Neutrophils Absolute: 4.7 10*3/uL (ref 1.4–7.0)
Neutrophils: 57 %
Platelets: 286 10*3/uL (ref 150–450)
RBC: 5.02 x10E6/uL (ref 3.77–5.28)
RDW: 12.3 % (ref 11.7–15.4)
WBC: 8.1 10*3/uL (ref 3.4–10.8)

## 2019-08-02 LAB — CMP14+EGFR
ALT: 10 IU/L (ref 0–32)
AST: 19 IU/L (ref 0–40)
Albumin/Globulin Ratio: 1.7 (ref 1.2–2.2)
Albumin: 4.4 g/dL (ref 3.8–4.8)
Alkaline Phosphatase: 99 IU/L (ref 39–117)
BUN/Creatinine Ratio: 16 (ref 12–28)
BUN: 14 mg/dL (ref 8–27)
Bilirubin Total: 0.6 mg/dL (ref 0.0–1.2)
CO2: 27 mmol/L (ref 20–29)
Calcium: 9.6 mg/dL (ref 8.7–10.3)
Chloride: 102 mmol/L (ref 96–106)
Creatinine, Ser: 0.87 mg/dL (ref 0.57–1.00)
GFR calc Af Amer: 78 mL/min/{1.73_m2} (ref >59)
GFR calc non Af Amer: 68 mL/min/{1.73_m2} (ref >59)
Globulin, Total: 2.6 g/dL (ref 1.5–4.5)
Glucose: 103 mg/dL — ABNORMAL HIGH (ref 65–99)
Potassium: 3.8 mmol/L (ref 3.5–5.2)
Sodium: 142 mmol/L (ref 134–144)
Total Protein: 7 g/dL (ref 6.0–8.5)

## 2019-08-02 LAB — TSH: TSH: 1.25 u[IU]/mL (ref 0.450–4.500)

## 2019-08-02 LAB — VITAMIN B12: Vitamin B-12: 625 pg/mL (ref 232–1245)

## 2019-08-02 LAB — RPR: RPR Ser Ql: NONREACTIVE

## 2019-08-06 ENCOUNTER — Ambulatory Visit: Payer: Self-pay | Admitting: Licensed Clinical Social Worker

## 2019-08-06 DIAGNOSIS — I10 Essential (primary) hypertension: Secondary | ICD-10-CM

## 2019-08-06 DIAGNOSIS — F411 Generalized anxiety disorder: Secondary | ICD-10-CM

## 2019-08-06 DIAGNOSIS — E782 Mixed hyperlipidemia: Secondary | ICD-10-CM

## 2019-08-06 NOTE — Chronic Care Management (AMB) (Signed)
  Care Management Note   Joy Bender is a 70 y.o. year old female who is a primary care patient of Dettinger, Fransisca Kaufmann, MD. The CM team was consulted for assistance with chronic disease management and care coordination.   I reached out to Joy Bender, spouse, by phone today.   [name] , spouse, were given information about Chronic Care Management services today including:  1. CCM service includes personalized support from designated clinical staff supervised by her physician, including individualized plan of care and coordination with other care providers 2. 24/7 contact phone numbers for assistance for urgent and routine care needs. 3. Service will only be billed when office clinical staff spend 20 minutes or more in a month to coordinate care. 4. Only one practitioner may furnish and bill the service in a calendar month. 5. The patient may stop CCM services at any time (effective at the end of the month) by phone call to the office staff. 6. The patient will be responsible for cost sharing (co-pay) of up to 20% of the service fee (after annual deductible is met). Patient Joy Bender, spouse, did not agree to services and wish to consider information provided before deciding about enrollment in care management services.    Review of patient status, including review of consultants reports, relevant laboratory and other test results, and collaboration with appropriate care team members and the patient's provider was performed as part of comprehensive patient evaluation and provision of chronic care management services.   Medications   aspirin 81 MG tablet donepezil (ARICEPT) 5 MG tablet lisinopril-hydrochlorothiazide (ZESTORETIC) 20-12.5 MG tablet sertraline (ZOLOFT) 50 MG tablet  LCSW spoke via phone today with Joy Bender, spouse of client about CCM program services. LCSW described to Joy the RNCM support through CCM program. LCSW described to  Joy the LCSW support through CCM program.  Joy was appreciative of information given; however, he said he would need to call LCSW back in next few days to talk more with LCSW at that time about CCM program services. Joy said he would try to call LCSW tomorrow if possible at LCSW phone number of [phone] to talk more about CCM program support for Joy Bender  Follow Up Plan: Joy Bender to call LCSW at [phone] in next few days to discuss further CCM program services  Joy Bender.Joy Bender MSW, LCSW Licensed Clinical Social Worker [facility] [phone]

## 2019-08-06 NOTE — Patient Instructions (Addendum)
Licensed Clinical Social Worker Visit Information  Materials Provided: No  I reached out to Clint, spouse, by phone today.   Ms. Andrea Ferrer , spouse, were given information about Chronic Care Management services today including:  1. CCM service includes personalized support from designated clinical staff supervised by her physician, including individualized plan of care and coordination with other care providers 2. 24/7 contact phone numbers for assistance for urgent and routine care needs. 3. Service will only be billed when office clinical staff spend 20 minutes or more in a month to coordinate care. 4. Only one practitioner may furnish and bill the service in a calendar month. 5. The patient may stop CCM services at any time (effective at the end of the month) by phone call to the office staff. 6. The patient will be responsible for cost sharing (co-pay) of up to 20% of the service fee (after annual deductible is met). Patient [name], spouse, did not agree to services and wish to consider information provided before deciding about enrollment in care management services.   LCSW spoke via phone today with Dewitt Pinkney, spouse of client about CCM program services. LCSW described to Dewitt the RNCM support through CCM program. LCSW described to Dewitt the LCSW support through CCM program.  Dewitt was appreciative of information given; however, he said he would need to call LCSW back in next few days to talk more with LCSW at that time about CCM program services. Dewitt said he would try to call LCSW tomorrow if possible at LCSW phone number of [phone] to talk more about CCM program support for Kaybree C. Owens Shark  Follow Up Plan: Dewitt Tunnell to call LCSW at [phone] in next few days to discuss further CCM program services  The patient Joy Bender, spouse, verbalized understanding of instructions provided today and declined a print copy of patient  instruction materials.   Norva Riffle.Mirabel Ahlgren MSW, LCSW Licensed Clinical Social Worker [facility] Care [facility] [phone]

## 2019-08-08 ENCOUNTER — Ambulatory Visit: Payer: Self-pay | Admitting: Licensed Clinical Social Worker

## 2019-08-08 DIAGNOSIS — E782 Mixed hyperlipidemia: Secondary | ICD-10-CM

## 2019-08-08 DIAGNOSIS — I10 Essential (primary) hypertension: Secondary | ICD-10-CM

## 2019-08-08 DIAGNOSIS — F411 Generalized anxiety disorder: Secondary | ICD-10-CM

## 2019-08-08 NOTE — Chronic Care Management (AMB) (Signed)
  Care Management Note   Joy Bender is a 69 y.o. year old female who is a primary care patient of Dettinger, Fransisca Kaufmann, MD. The CM team was consulted for assistance with chronic disease management and care coordination.   I reached out to Joy Bender ,spouse,by phone today.   [name], spouse , were given information about Chronic Care Management services today including:  1. CCM service includes personalized support from designated clinical staff supervised by her physician, including individualized plan of care and coordination with other care providers 2. 24/7 contact phone numbers for assistance for urgent and routine care needs. 3. Service will only be billed when office clinical staff spend 20 minutes or more in a month to coordinate care. 4. Only one practitioner may furnish and bill the service in a calendar month. 5. The patient may stop CCM services at any time (effective at the end of the month) by phone call to the office staff. 6. The patient will be responsible for cost sharing (co-pay) of up to 20% of the service fee (after annual deductible is met). Patient /Joy Bender , spouse,did not agree to enrollment in care management services and do not wish to consider at this time.   Review of patient status, including review of consultants reports, relevant laboratory and other test results, and collaboration with appropriate care team members and the patient's provider was performed as part of comprehensive patient evaluation and provision of chronic care management services.   Medications   aspirin 81 MG tablet donepezil (ARICEPT) 5 MG tablet lisinopril-hydrochlorothiazide (ZESTORETIC) 20-12.5 MG tablet sertraline (ZOLOFT) 50 MG tablet  LCSW spoke via phone today with Joy Bender, spouse of Joy Bender. LCSW talked with Joy about CCM program services with RNCM and with LCSW. Joy Bender, spouse of client, declined CCM program  services for Joy Bender at this time.  Joy said he has the phone number for LCSW to call LCSW if he has further interest or questions about CCM program participation for Joy Bender.  Joy Bender MSW, LCSW Licensed Clinical Social Worker [facility] [phone]

## 2019-08-08 NOTE — Patient Instructions (Addendum)
Licensed Clinical Water engineer Provided: No  I reached out to [place] ,spouse,by phone today.   Ms. Jullie Arps, spouse , were given information about Chronic Care Management services today including:  1. CCM service includes personalized support from designated clinical staff supervised by her physician, including individualized plan of care and coordination with other care providers 2. 24/7 contact phone numbers for assistance for urgent and routine care needs. 3. Service will only be billed when office clinical staff spend 20 minutes or more in a month to coordinate care. 4. Only one practitioner may furnish and bill the service in a calendar month. 5. The patient may stop CCM services at any time (effective at the end of the month) by phone call to the office staff. 6. The patient will be responsible for cost sharing (co-pay) of up to 20% of the service fee (after annual deductible is met). Patient /Joy Bender , spouse,did not agree to enrollment in care management services and do not wish to consider at this time.   LCSW spoke via phone today with Joy Bender, spouse of JASMINNE MEALY. LCSW talked with Joy about CCM program services with RNCM and with LCSW. Joy Bender, spouse of client, declined CCM program services for [name] at this time.  Joy said he has the phone number for LCSW to call LCSW if he has futher interest or questions about CCM program participation for [name].  The patient/Joy Wrobel,spose,verbalized understanding of instructions provided today and declined a print copy of patient instruction materials.   Norva Riffle.Santiaga Butzin MSW, LCSW Licensed Clinical Social Worker [facility] [phone]

## 2019-08-16 ENCOUNTER — Other Ambulatory Visit: Payer: Self-pay

## 2019-08-16 ENCOUNTER — Encounter: Payer: Self-pay | Admitting: Obstetrics & Gynecology

## 2019-08-16 ENCOUNTER — Ambulatory Visit: Payer: Medicare Other | Admitting: Obstetrics & Gynecology

## 2019-08-16 VITALS — BP 134/81 | HR 77 | Ht 63.0 in | Wt 129.0 lb

## 2019-08-16 DIAGNOSIS — N814 Uterovaginal prolapse, unspecified: Secondary | ICD-10-CM | POA: Diagnosis not present

## 2019-08-16 DIAGNOSIS — N8110 Cystocele, unspecified: Secondary | ICD-10-CM

## 2019-08-16 DIAGNOSIS — N3941 Urge incontinence: Secondary | ICD-10-CM | POA: Diagnosis not present

## 2019-08-16 MED ORDER — SOLIFENACIN SUCCINATE 10 MG PO TABS: 10.0000 mg | ORAL_TABLET | Freq: Every day | ORAL | 11 refills | Status: DC

## 2019-08-16 NOTE — Progress Notes (Signed)
Chief Complaint  Patient presents with  . Prolapsed uterus    Referral from [place]      69 y.o. No obstetric history on file. No LMP recorded. Patient is postmenopausal. The current method of family planning is post menopausal status.  Outpatient Encounter Medications as of 08/16/2019  Medication Sig  . aspirin 81 MG tablet Take 81 mg by mouth daily.    Marland Kitchen donepezil (ARICEPT) 5 MG tablet Take 1 tablet (5 mg total) by mouth at bedtime.  Marland Kitchen lisinopril-hydrochlorothiazide (ZESTORETIC) 20-12.5 MG tablet Take 1 tablet by mouth daily.  . sertraline (ZOLOFT) 50 MG tablet Take 1 tablet (50 mg total) by mouth daily.  . solifenacin (VESICARE) 10 MG tablet Take 1 tablet (10 mg total) by mouth daily.   No facility-administered encounter medications on file as of 08/16/2019.    Subjective Referred from [facility] for prolpase  Pt has Alzheimer's so husband serves as her communicator She has had symptoms for 2 months Aware of something hanging out No bleeding Gets up 5-6 times at night to use the bathroom For about 1-2 months as well Past Medical History:  Diagnosis Date  . Atypical chest pain    Status post admission in June 2012 ruled out for myocardial infarction status post stress echocardiogram without evidence for ischemia.  . Hyperlipidemia    Lipid panel June 2012 cholesterol 214, triglycerides 144 HDL 40 LDL 145  . Hypertension   . Paroxysmal atrial fibrillation ([place])    per Cardionet Monitor in 2009 only single episode of a brief run of atrial fibrillation noted on cardiac monitoring 2009 no clinical recurrence.   Marland Kitchen PVC's (premature ventricular contractions)     History reviewed. No pertinent surgical history.  OB History   No obstetric history on file.     No Known Allergies  Social History   Socioeconomic History  . Marital status: Married    Spouse name: Not on file  . Number of children: 3  . Years of education: Not on file  .  Highest education level: Not on file  Occupational History  . Occupation: [employer]  Tobacco Use  . Smoking status: Never Smoker  . Smokeless tobacco: Never Used  Substance and Sexual Activity  . Alcohol use: No    Alcohol/week: 0.0 standard drinks  . Drug use: No  . Sexual activity: Not on file  Other Topics Concern  . Not on file  Social History Narrative  . Not on file   Social Determinants of Health   Financial Resource Strain:   . Difficulty of Paying Living Expenses: Not on file  Food Insecurity:   . Worried About Charity fundraiser in the Last Year: Not on file  . Ran Out of Food in the Last Year: Not on file  Transportation Needs:   . Lack of Transportation (Medical): Not on file  . Lack of Transportation (Non-Medical): Not on file  Physical Activity:   . Days of Exercise per Week: Not on file  . Minutes of Exercise per Session: Not on file  Stress:   . Feeling of Stress : Not on file  Social Connections:   . Frequency of Communication with Friends and Family: Not on file  . Frequency of Social Gatherings with Friends and Family: Not on file  . Attends Religious Services: Not on file  . Active Member of Clubs or Organizations: Not on file  . Attends Archivist Meetings: Not  on file  . Marital Status: Not on file    Family History  Problem Relation Age of Onset  . Cancer Brother   . Cancer Son        liver    Medications:       Current Outpatient Medications:  .  aspirin 81 MG tablet, Take 81 mg by mouth daily.  , Disp: , Rfl:  .  donepezil (ARICEPT) 5 MG tablet, Take 1 tablet (5 mg total) by mouth at bedtime., Disp: 90 tablet, Rfl: 3 .  lisinopril-hydrochlorothiazide (ZESTORETIC) 20-12.5 MG tablet, Take 1 tablet by mouth daily., Disp: 90 tablet, Rfl: 3 .  sertraline (ZOLOFT) 50 MG tablet, Take 1 tablet (50 mg total) by mouth daily., Disp: 90 tablet, Rfl: 3 .  solifenacin (VESICARE) 10 MG tablet, Take 1 tablet (10 mg total) by mouth daily.,  Disp: 30 tablet, Rfl: 11  Objective Blood pressure 134/81, pulse 77, height 5\' 3"  (1.6 m), weight 129 lb (58.5 kg).  General WDWN female NAD Vulva:  normal appearing vulva with no masses, tenderness or lesions Vagina:  normal mucosa, no discharge, Grade 3 anterior compartment POP Cervix:  Normal no lesions Uterus:  normal size, contour, position, consistency, mobility, non-tender, Grade 2 uterine prolapse Adnexa: ovaries:present,  normal adnexa in size, nontender and no masses   Pertinent ROS No burning with urination, frequency or urgency No nausea, vomiting or diarrhea Nor fever chills or other constitutional symptoms   Labs or studies     Impression Diagnoses this Encounter::   ICD-10-CM   1. POP-Q stage 3 cystocele  N81.10    Fit for Milex ring with support #4  2. Urge incontinence  N39.41    Pessary should help, also start vesicare 10 mg nightly  3. Uterine prolapse, Grade 2,   N81.4    also managed with the pessary Milex ring #4    Established relevant diagnosis(es):   Plan/Recommendations: Meds ordered this encounter  Medications  . solifenacin (VESICARE) 10 MG tablet    Sig: Take 1 tablet (10 mg total) by mouth daily.    Dispense:  30 tablet    Refill:  11    Labs or Scans Ordered: No orders of the defined types were placed in this encounter.   Management:: As above under impression  Follow up Return in about 1 month (around 09/13/2019) for Follow up, with Dr 11/13/2019.           All questions were answered.

## 2019-08-27 ENCOUNTER — Telehealth: Payer: Self-pay | Admitting: Obstetrics & Gynecology

## 2019-08-27 ENCOUNTER — Telehealth: Payer: Self-pay | Admitting: *Deleted

## 2019-08-27 MED ORDER — METRONIDAZOLE 500 MG PO TABS: 500.0000 mg | ORAL_TABLET | Freq: Two times a day (BID) | ORAL | 0 refills | Status: DC

## 2019-08-27 NOTE — Telephone Encounter (Signed)
Patient's husband left message about problems with pessary. Telephoned home number spoke with husband. Patient is having [place] discharge for 2 weeks. Discharge does have odor. Advised would check with doctor and call back.

## 2019-08-27 NOTE — Telephone Encounter (Signed)
Telephoned patient's home # and left message for patient's husband that medication had been called into pharmacy. If any questions could call back.

## 2019-08-27 NOTE — Telephone Encounter (Signed)
E prescribed the pt   Metronidazole orally

## 2019-08-30 ENCOUNTER — Ambulatory Visit: Payer: Medicare Other | Admitting: Family Medicine

## 2019-09-03 ENCOUNTER — Telehealth: Payer: Self-pay

## 2019-09-05 ENCOUNTER — Ambulatory Visit: Payer: Medicare Other | Admitting: Family Medicine

## 2019-09-13 ENCOUNTER — Encounter: Payer: Self-pay | Admitting: Obstetrics & Gynecology

## 2019-09-13 ENCOUNTER — Other Ambulatory Visit: Payer: Self-pay

## 2019-09-13 ENCOUNTER — Ambulatory Visit: Payer: Medicare Other | Admitting: Obstetrics & Gynecology

## 2019-09-13 VITALS — BP 143/84 | HR 76 | Ht 63.0 in | Wt 132.0 lb

## 2019-09-13 DIAGNOSIS — N8110 Cystocele, unspecified: Secondary | ICD-10-CM | POA: Diagnosis not present

## 2019-09-13 DIAGNOSIS — N3941 Urge incontinence: Secondary | ICD-10-CM | POA: Diagnosis not present

## 2019-09-13 DIAGNOSIS — N814 Uterovaginal prolapse, unspecified: Secondary | ICD-10-CM

## 2019-09-13 DIAGNOSIS — Z4689 Encounter for fitting and adjustment of other specified devices: Secondary | ICD-10-CM

## 2019-09-13 NOTE — Progress Notes (Signed)
Chief Complaint  Patient presents with  . Follow-up    Pessary    Blood pressure (!) 143/84, pulse 76, height 5\' 3"  (1.6 m), weight 132 lb (59.9 kg).  presents today for routine follow up related to her pessary.   She uses a Milex ring with support #4 She reports no vaginal discharge or vaginal bleeding.  She is also on vesicare 10 mg qhs and is doing great on that.  Only getting up 1 time per night compred to 3-4  Exam reveals no undue vaginal mucosal pressure of breakdown, no discharge and no vaginal bleeding.  The pessary is removed, cleaned and replaced without difficulty.    Joy Bender will be sen back in 3 months for continued follow up.    ICD-10-CM   1. Pessary maintenance< Milex ring with support #4  Z46.89   2. POP-Q stage 3 cystocele  N81.10   3. Urge incontinence  N39.41    Much improved.  Continue vesicare  4. Uterine prolapse, Grade 2,   N81.4      Joy Raring, MD  09/13/2019 9:08 AM

## 2019-11-01 ENCOUNTER — Other Ambulatory Visit: Payer: Self-pay

## 2019-11-01 ENCOUNTER — Encounter: Payer: Self-pay | Admitting: Adult Health

## 2019-11-01 ENCOUNTER — Ambulatory Visit: Payer: Medicare Other | Admitting: Adult Health

## 2019-11-01 VITALS — BP 137/78 | HR 73 | Ht 63.0 in | Wt 132.0 lb

## 2019-11-01 DIAGNOSIS — N899 Noninflammatory disorder of vagina, unspecified: Secondary | ICD-10-CM

## 2019-11-01 DIAGNOSIS — Z466 Encounter for fitting and adjustment of urinary device: Secondary | ICD-10-CM | POA: Diagnosis not present

## 2019-11-01 DIAGNOSIS — N898 Other specified noninflammatory disorders of vagina: Secondary | ICD-10-CM | POA: Insufficient documentation

## 2019-11-01 DIAGNOSIS — T8389XA Other specified complication of genitourinary prosthetic devices, implants and grafts, initial encounter: Secondary | ICD-10-CM

## 2019-11-01 NOTE — Progress Notes (Signed)
  Subjective:     Patient ID: Joy Bender, female   DOB: April 16, 1951, 69 y.o.   MRN: [id number]  HPI Joy Bender is a 69 year old white female,married, PM with pessary in complaining of [place] discharge for about 2 weeks, husband is with her. PCP is Dr Louanne Skye.  Review of Systems [place] discharge for about 2 weeks  Reviewed past medical,surgical, social and family history. Reviewed medications and allergies.     Objective:   Physical Exam BP 137/78 (BP Location: Left Arm, Patient Position: Sitting, Cuff Size: Normal)   Pulse 73   Ht 5\' 3"  (1.6 m)   Wt 132 lb (59.9 kg)   BMI 23.38 kg/m    Skin warm and dry.Pelvic: external genitalia is normal in appearance no lesions, vagina has no lesion noted, scant tan discharge with no odor,+cystocele, at introitus.  pessary removed and cleaned and reinserted,urethra has no lesions or masses noted, cervix:smooth uterus: normal size, shape and contour, non tender, no masses felt, adnexa: no masses or tenderness noted. Bladder is non tender and no masses felt.  Examination chaperoned by LPN  Assessment:     1. Vaginal irritation from pessary I think pessary had moved out of place    Plan:     Call if any more discharge, will Rx Metrogel then Follow up prn

## 2019-11-12 ENCOUNTER — Ambulatory Visit: Payer: Medicare Other | Admitting: Obstetrics & Gynecology

## 2019-11-12 ENCOUNTER — Encounter: Payer: Self-pay | Admitting: Obstetrics & Gynecology

## 2019-11-12 ENCOUNTER — Other Ambulatory Visit: Payer: Self-pay

## 2019-11-12 VITALS — BP 159/87 | HR 75 | Ht 63.0 in | Wt 130.0 lb

## 2019-11-12 DIAGNOSIS — N814 Uterovaginal prolapse, unspecified: Secondary | ICD-10-CM

## 2019-11-12 DIAGNOSIS — N8110 Cystocele, unspecified: Secondary | ICD-10-CM

## 2019-11-12 NOTE — Progress Notes (Signed)
Patient ID: Joy Bender, female   DOB: 09-Jan-1951, 69 y.o.   MRN: [id number] Chief Complaint  Patient presents with  . Pessary Check    Blood pressure (!) 159/87, pulse 75, height 5\' 3"  (1.6 m), weight 130 lb (59 kg).  presents today for routine follow up related to her pessary.   She uses a Milex ring with support #4 However she states while the pessary is comfortable she has a "buge" coming thru which is uncomfortable  On exam her bladder is "sliding" dwon on the upper side of the pessary She has a large anterior defect noted at the time of her fitting 08/16/19 but also has grade 2 uterine prolapse Her nocturnal urinary frequency is much improved on the vesicare and she is not having any adverse effects  She reports no vaginal discharge or vaginal bleeding.  Exam reveals no undue vaginal mucosal pressure of breakdown, no discharge and no vaginal bleeding.  In order to support her POP she is fit for a Gelhorn pessary 2.5 inches which is much better from a purely support standpoint but of course carries with it a higher risk of mucosal irritationa dn potential breakdown  The pessary is removed and I refit her with the Gelhorn pessary   She states it is comfortable with less pressure feeling  [name] will be sen back in 1 months for continued follow up.  Susa Raring, MD  11/12/2019 10:53 AM

## 2019-11-23 ENCOUNTER — Encounter: Payer: Self-pay | Admitting: *Deleted

## 2019-11-23 ENCOUNTER — Telehealth: Payer: Self-pay | Admitting: *Deleted

## 2019-11-23 NOTE — Telephone Encounter (Signed)
Mother is getting worse with memory.  Her husband is very sick with cancer.  Patient's son is trying to get a power of attorney done to help them.  He needs a note from the provider explaining that mother can probably comprehend that she will be signing this paper work.  She had a mini mental exam done in January with her provider.  Please help with this request.

## 2019-11-23 NOTE — Telephone Encounter (Signed)
Letter written and waiting for provider's signature.

## 2019-11-23 NOTE — Telephone Encounter (Signed)
Patient's son,  Feliz Beam, (PHONE [phone]),  aware letter is ready.

## 2019-11-23 NOTE — Telephone Encounter (Signed)
MMSE - Mini Mental State Exam 08/01/2019  Orientation to time 0  Orientation to Place 3  Registration 3  Attention/ Calculation 4  Recall 0  Language- name 2 objects 2  Language- repeat 1  Language- follow 3 step command 3  Language- read & follow direction 1  Write a sentence 1  Copy design 0  Total score 18    Patient's Mini-Mental status shows that she has moderate dementia but she is still able to make some decisions, I am fine with doing a letter saying that she can still make some decisions for signing paperwork. Arville Care, MD [facility] 11/23/2019, 1:07 PM

## 2019-11-26 ENCOUNTER — Ambulatory Visit (INDEPENDENT_AMBULATORY_CARE_PROVIDER_SITE_OTHER): Payer: Medicare Other

## 2019-11-26 ENCOUNTER — Other Ambulatory Visit: Payer: Self-pay

## 2019-11-26 VITALS — BP 136/88 | HR 78 | Temp 97.9°F | Resp 20 | Ht 63.0 in | Wt 130.0 lb

## 2019-11-26 DIAGNOSIS — R251 Tremor, unspecified: Secondary | ICD-10-CM

## 2019-11-26 NOTE — Progress Notes (Signed)
Patient presents to clinic with son. Son drove her here to office. She seems very anxious and shaky. Son states that she hasn't taken her rx meds in months and they gave her her BP med and also zoloft this AM. Shortly after patient became very anxious and shaky. BP today was 136/88 with a pulse of 78. After sitting for a little while with son he seemed to calm her slightly. I spoke with provider on call and her PCP and both recommended an ER evaluation just to make sure everything is ok due to a recent change. Son agreed and verbalized understanding. I advised son if he needed anything further to contact our office. He will drive patient to ER if symptoms persist

## 2019-12-12 ENCOUNTER — Telehealth: Payer: Self-pay | Admitting: *Deleted

## 2019-12-12 ENCOUNTER — Telehealth: Payer: Self-pay | Admitting: Obstetrics & Gynecology

## 2019-12-12 NOTE — Telephone Encounter (Signed)
Patient's daughter called stating that Patient has an appointment tomorrow for a F/U bladder issue. Pt states that her mom has been doing well and she does not think she needs this appointment. Pt would like to know if she really has to bring her. Please contact pt's daughter.

## 2019-12-12 NOTE — Telephone Encounter (Signed)
LMOVM returning daughter's call.

## 2019-12-12 NOTE — Telephone Encounter (Signed)
Daughter states Joy Bender is doing great with her pessary and has no complaints. She is requesting that she not be seen if it is not necessary as her mother has had a lot going on with appts and such.  Informed a different pessary was placed and he will check the area to make sure there is no irritation, etc.  Daughter states they would still like to go ahead and reschedule this appt. Appt rescheduled to 7/6.

## 2019-12-13 ENCOUNTER — Ambulatory Visit: Payer: Medicare Other | Admitting: Obstetrics & Gynecology

## 2019-12-14 ENCOUNTER — Ambulatory Visit: Payer: Medicare Other | Admitting: Obstetrics & Gynecology

## 2020-01-15 ENCOUNTER — Ambulatory Visit: Payer: Medicare Other | Admitting: Obstetrics & Gynecology

## 2020-02-18 ENCOUNTER — Ambulatory Visit (INDEPENDENT_AMBULATORY_CARE_PROVIDER_SITE_OTHER): Payer: Medicare Other | Admitting: Nurse Practitioner

## 2020-02-18 ENCOUNTER — Encounter: Payer: Self-pay | Admitting: Nurse Practitioner

## 2020-02-18 ENCOUNTER — Other Ambulatory Visit: Payer: Self-pay

## 2020-02-18 VITALS — BP 147/75 | HR 70 | Temp 98.0°F | Resp 20 | Ht 63.0 in | Wt 140.0 lb

## 2020-02-18 DIAGNOSIS — F0390 Unspecified dementia without behavioral disturbance: Secondary | ICD-10-CM

## 2020-02-18 DIAGNOSIS — Z111 Encounter for screening for respiratory tuberculosis: Secondary | ICD-10-CM

## 2020-02-18 NOTE — Addendum Note (Signed)
Addended by: Dorene Sorrow on: 02/18/2020 05:12 PM   Modules accepted: Orders

## 2020-02-18 NOTE — Progress Notes (Signed)
Established Patient Office Visit  Subjective:  Patient ID: Joy Bender, female    DOB: 03/15/1951  Age: 69 y.o. MRN: [id number]  CC:    HPI BERNECE GALL presents to complete [place] 2 paperwork.  Patient is moving to a facility.  Patient is exhibiting some memory loss today.  MMSE completed patient score is a 10 showing moderate dementia which is not new for patient according to son.  Patient has no new concerns today.  Patient is oriented x2, exhibiting signs of short-term memory loss, denies any pain and discomfort.  She is not reporting any fever, headaches, dizziness or at risk of self-harm.   Past Medical History:  Diagnosis Date   Atypical chest pain    Status post admission in June 2012 ruled out for myocardial infarction status post stress echocardiogram without evidence for ischemia.   Hyperlipidemia    Lipid panel June 2012 cholesterol 214, triglycerides 144 HDL 40 LDL 145   Hypertension    Paroxysmal atrial fibrillation (HCC)    per Cardionet Monitor in 2009 only single episode of a brief run of atrial fibrillation noted on cardiac monitoring 2009 no clinical recurrence.    PVC's (premature ventricular contractions)       Family History  Problem Relation Age of Onset   Cancer Brother    Cancer Son        liver    Social History   Socioeconomic History   Marital status: Married    Spouse name: Not on file   Number of children: 3   Years of education: Not on file   Highest education level: Not on file  Occupational History   Occupation: [employer]  Tobacco Use   Smoking status: Never Smoker   Smokeless tobacco: Never Used  Vaping Use   Vaping Use: Never used  Substance and Sexual Activity   Alcohol use: No    Alcohol/week: 0.0 standard drinks   Drug use: No   Sexual activity: Not Currently    Birth control/protection: Post-menopausal  Other Topics Concern   Not on file  Social History Narrative   Not on file   Social  Determinants of Health   Financial Resource Strain:    Difficulty of Paying Living Expenses:   Food Insecurity:    Worried About Programme researcher, broadcasting/film/video in the Last Year:    Barista in the Last Year:   Transportation Needs:    Freight forwarder (Medical):    Lack of Transportation (Non-Medical):   Physical Activity:    Days of Exercise per Week:    Minutes of Exercise per Session:   Stress:    Feeling of Stress :   Social Connections:    Frequency of Communication with Friends and Family:    Frequency of Social Gatherings with Friends and Family:    Attends Religious Services:    Active Member of Clubs or Organizations:    Attends Engineer, structural:    Marital Status:   Intimate Partner Violence:    Fear of Current or Ex-Partner:    Emotionally Abused:    Physically Abused:    Sexually Abused:     Outpatient Medications Prior to Visit  Medication Sig Dispense Refill   loratadine (CLARITIN) 10 MG tablet Take 10 mg by mouth daily.     sertraline (ZOLOFT) 50 MG tablet Take 1 tablet (50 mg total) by mouth daily. 90 tablet 3   aspirin 81 MG tablet Take  81 mg by mouth daily.       donepezil (ARICEPT) 5 MG tablet Take 1 tablet (5 mg total) by mouth at bedtime. (Patient not taking: Reported on 11/26/2019) 90 tablet 3   lisinopril-hydrochlorothiazide (ZESTORETIC) 20-12.5 MG tablet Take 1 tablet by mouth daily. 90 tablet 3   solifenacin (VESICARE) 10 MG tablet Take 1 tablet (10 mg total) by mouth daily. (Patient not taking: Reported on 11/26/2019) 30 tablet 11   No facility-administered medications prior to visit.    No Known Allergies  ROS Review of Systems  Constitutional: Negative.   HENT: Negative.   Eyes: Negative.   Respiratory: Negative.   Cardiovascular: Negative.   Gastrointestinal: Negative.   Genitourinary: Negative.   Musculoskeletal: Negative.   Skin: Negative.   Psychiatric/Behavioral: Positive for decreased  concentration.      Objective:    Physical Exam Constitutional:      Appearance: Normal appearance.  HENT:     Head: Normocephalic.  Eyes:     Conjunctiva/sclera: Conjunctivae normal.  Cardiovascular:     Rate and Rhythm: Normal rate and regular rhythm.     Pulses: Normal pulses.     Heart sounds: Normal heart sounds.  Pulmonary:     Effort: Pulmonary effort is normal.     Breath sounds: Normal breath sounds.  Abdominal:     General: Bowel sounds are normal.  Musculoskeletal:        General: Normal range of motion.  Skin:    General: Skin is warm.  Neurological:     Mental Status: She is disoriented.     Comments: Moderate dementia  Psychiatric:        Mood and Affect: Mood normal.        Behavior: Behavior normal.    MMSE - Mini Mental State Exam 02/18/2020 08/01/2019  Orientation to time 0 0  Orientation to Place 2 3  Registration 3 3  Attention/ Calculation 0 4  Recall 0 0  Language- name 2 objects 2 2  Language- repeat 1 1  Language- follow 3 step command 1 3  Language- read & follow direction 0 1  Write a sentence 1 1  Copy design 0 0  Total score 10 18    Resp 20    Ht 5\' 3"  (1.6 m)    Wt 140 lb (63.5 kg)    BMI 24.80 kg/m  Wt Readings from Last 3 Encounters:  02/18/20 140 lb (63.5 kg)  11/26/19 130 lb (59 kg)  11/12/19 130 lb (59 kg)     Health Maintenance Due  Topic Date Due   COVID-19 Vaccine (1) Never done   TETANUS/TDAP  Never done   MAMMOGRAM  Never done   COLONOSCOPY  Never done   PNA vac Low Risk Adult (1 of 2 - PCV13) Never done   DEXA SCAN  01/04/2019   INFLUENZA VACCINE  02/10/2020    There are no preventive care reminders to display for this patient.  Lab Results  Component Value Date   TSH 1.250 08/01/2019   Lab Results  Component Value Date   WBC 8.1 08/01/2019   HGB 15.4 08/01/2019   HCT 44.2 08/01/2019   MCV 88 08/01/2019   PLT 286 08/01/2019   Lab Results  Component Value Date   NA 142 08/01/2019   K 3.8  08/01/2019   CO2 27 08/01/2019   GLUCOSE 103 (H) 08/01/2019   BUN 14 08/01/2019   CREATININE 0.87 08/01/2019   BILITOT 0.6 08/01/2019  ALKPHOS 99 08/01/2019   AST 19 08/01/2019   ALT 10 08/01/2019   PROT 7.0 08/01/2019   ALBUMIN 4.4 08/01/2019   CALCIUM 9.6 08/01/2019   Lab Results  Component Value Date   CHOL 199 06/16/2018   Lab Results  Component Value Date   HDL 50 06/16/2018   Lab Results  Component Value Date   LDLCALC 124 (H) 06/16/2018   Lab Results  Component Value Date   TRIG 126 06/16/2018   Lab Results  Component Value Date   CHOLHDL 4.0 06/16/2018   Lab Results  Component Value Date   HGBA1C 5.6 01/21/2011      Assessment & Plan:   Problem List Items Addressed This Visit      Nervous and Auditory   Dementia without behavioral disturbance (HCC) - Primary    Patient is a 69 year old female who presents to complete [place] 2 paperwork today patient is moving to facility, patient is exhibiting some memory loss today.  MMSE completed patient scores a 10 showing moderate dementia which is not new for patient according to son.  Patient has no new concerns today, patient is oriented x2, exhibiting signs of short-term memory loss, denies any pain and discomfort.  She is not reporting any fever, headache, dizziness or risk of self-harm. [place] 2 paperwork completed.  Education provided to son [caregiver] on safety.           Follow-up: Return if symptoms worsen or fail to improve.    Daryll Drown, NP

## 2020-02-18 NOTE — Assessment & Plan Note (Signed)
Patient is a 69 year old female who presents to complete [place] 2 paperwork today patient is moving to facility, patient is exhibiting some memory loss today.  MMSE completed patient scores a 10 showing moderate dementia which is not new for patient according to son.  Patient has no new concerns today, patient is oriented x2, exhibiting signs of short-term memory loss, denies any pain and discomfort.  She is not reporting any fever, headache, dizziness or risk of self-harm. FL 2 paperwork completed.  Education provided to son [caregiver] on safety.

## 2020-02-18 NOTE — Patient Instructions (Addendum)
Dementia without behavioral disturbance [facility]) Patient is a 69 year old female who presents to complete [place] 2 paperwork today patient is moving to facility, patient is exhibiting some memory loss today.  MMSE completed patient scores a 10 showing moderate dementia which is not new for patient according to son.  Patient has no new concerns today, patient is oriented x2, exhibiting signs of short-term memory loss, denies any pain and discomfort.  She is not reporting any fever, headache, dizziness or risk of self-harm. FL 2 paperwork completed.  Education provided to son [caregiver] on safety.    Dementia Caregiver Guide Dementia is a term used to describe a number of symptoms that affect memory and thinking. The most common symptoms include:  Memory loss.  Trouble with language and communication.  Trouble concentrating.  Poor judgment.  Problems with reasoning.  Child-like behavior and language.  Extreme anxiety.  Angry outbursts.  Wandering from home or public places. Dementia usually gets worse slowly over time. In the early stages, people with dementia can stay independent and safe with some help. In later stages, they need help with daily tasks such as dressing, grooming, and using the bathroom. How to help the person with dementia cope Dementia can be frightening and confusing. Here are some tips to help the person with dementia cope with changes caused by the disease. General tips  Keep the person on track with his or her routine.  Try to identify areas where the person may need help.  Be supportive, patient, calm, and encouraging.  Gently remind the person that adjusting to changes takes time.  Help with the tasks that the person has asked for help with.  Keep the person involved in daily tasks and decisions as much as possible.  Encourage conversation, but try not to get frustrated or harried if the person struggles to find words or does not seem to appreciate your  help. Communication tips  When the person is talking or seems frustrated, make eye contact and hold the person's hand.  Ask specific questions that need yes or no answers.  Use simple words, short sentences, and a calm voice. Only give one direction at a time.  When offering choices, limit them to just 1 or 2.  Avoid correcting the person in a negative way.  If the person is struggling to find the right words, gently try to help him or her. How to recognize symptoms of stress Symptoms of stress in caregivers include:  Feeling frustrated or angry with the person with dementia.  Denying that the person has dementia or that his or her symptoms will not improve.  Feeling hopeless and unappreciated.  Difficulty sleeping.  Difficulty concentrating.  Feeling anxious, irritable, or depressed.  Developing stress-related health problems.  Feeling like you have too little time for your own life. Follow these instructions at home:   Make sure that you and the person you are caring for: ? Get regular sleep. ? Exercise regularly. ? Eat regular, nutritious meals. ? Drink enough fluid to keep your urine clear or pale yellow. ? Take over-the-counter and prescription medicines only as told by your health care providers. ? Attend all scheduled health care appointments.  Join a support group with others who are caregivers.  Ask about respite care resources so that you can have a regular break from the stress of caregiving.  Look for signs of stress in yourself and in the person you are caring for. If you notice signs of stress, take steps to manage  it.  Consider any safety risks and take steps to avoid them.  Organize medications in a pill box for each day of the week.  Create a plan to handle any legal or financial matters. Get legal or financial advice if needed.  Keep a calendar in a central location to remind the person of appointments or other activities. Tips for reducing  the risk of injury  Keep floors clear of clutter. Remove rugs, magazine racks, and floor lamps.  Keep hallways well lit, especially at night.  Put a handrail and nonslip mat in the bathtub or shower.  Put childproof locks on cabinets that contain dangerous items, such as medicines, alcohol, guns, toxic cleaning items, sharp tools or utensils, matches, and lighters.  Put the locks in places where the person cannot see or reach them easily. This will help ensure that the person does not wander out of the house and get lost.  Be prepared for emergencies. Keep a list of emergency phone numbers and addresses in a convenient area.  Remove car keys and lock garage doors so that the person does not try to get in the car and drive.  Have the person wear a bracelet that tracks locations and identifies the person as having memory problems. This should be worn at all times for safety. Where to find support: Many individuals and organizations offer support. These include:  Support groups for people with dementia and for caregivers.  Counselors or therapists.  Home health care services.  Adult day care centers. Where to find more information Alzheimer's Association: Limit[employer]hu Contact a health care provider if:  The person's health is rapidly getting worse.  You are no longer able to care for the person.  Caring for the person is affecting your physical and emotional health.  The person threatens himself or herself, you, or anyone else. Summary  Dementia is a term used to describe a number of symptoms that affect memory and thinking.  Dementia usually gets worse slowly over time.  Take steps to reduce the person's risk of injury, and to plan for future care.  Caregivers need support, relief from caregiving, and time for their own lives. This information is not intended to replace advice given to you by your health care provider. Make sure you discuss any questions you have with your  health care provider. Document Revised: 06/10/2017 Document Reviewed: 06/01/2016 Elsevier Patient Education  2020 [employer].

## 2020-02-20 ENCOUNTER — Ambulatory Visit (INDEPENDENT_AMBULATORY_CARE_PROVIDER_SITE_OTHER): Payer: Medicare Other

## 2020-02-20 ENCOUNTER — Other Ambulatory Visit: Payer: Self-pay

## 2020-02-20 DIAGNOSIS — Z111 Encounter for screening for respiratory tuberculosis: Secondary | ICD-10-CM

## 2020-02-20 LAB — TB SKIN TEST: TB Skin Test: NEGATIVE

## 2020-02-20 NOTE — Progress Notes (Signed)
TB negative today. Daughter in law request results to be faxed to [facility].  TB results faxed and copy given to daughter in law as well.

## 2020-02-26 DIAGNOSIS — Z20822 Contact with and (suspected) exposure to covid-19: Secondary | ICD-10-CM | POA: Diagnosis not present

## 2020-03-03 DIAGNOSIS — Z20822 Contact with and (suspected) exposure to covid-19: Secondary | ICD-10-CM | POA: Diagnosis not present

## 2020-03-12 DIAGNOSIS — Z20822 Contact with and (suspected) exposure to covid-19: Secondary | ICD-10-CM | POA: Diagnosis not present

## 2020-04-04 DIAGNOSIS — E785 Hyperlipidemia, unspecified: Secondary | ICD-10-CM | POA: Diagnosis not present

## 2020-04-08 DIAGNOSIS — E7849 Other hyperlipidemia: Secondary | ICD-10-CM | POA: Diagnosis not present

## 2020-04-08 DIAGNOSIS — Z79899 Other long term (current) drug therapy: Secondary | ICD-10-CM | POA: Diagnosis not present

## 2020-04-08 DIAGNOSIS — E038 Other specified hypothyroidism: Secondary | ICD-10-CM | POA: Diagnosis not present

## 2020-04-08 DIAGNOSIS — D518 Other vitamin B12 deficiency anemias: Secondary | ICD-10-CM | POA: Diagnosis not present

## 2020-04-08 DIAGNOSIS — E119 Type 2 diabetes mellitus without complications: Secondary | ICD-10-CM | POA: Diagnosis not present

## 2020-04-21 DIAGNOSIS — E7849 Other hyperlipidemia: Secondary | ICD-10-CM | POA: Diagnosis not present

## 2020-04-21 DIAGNOSIS — Z79899 Other long term (current) drug therapy: Secondary | ICD-10-CM | POA: Diagnosis not present

## 2020-04-21 DIAGNOSIS — D518 Other vitamin B12 deficiency anemias: Secondary | ICD-10-CM | POA: Diagnosis not present

## 2020-04-21 DIAGNOSIS — E119 Type 2 diabetes mellitus without complications: Secondary | ICD-10-CM | POA: Diagnosis not present

## 2020-04-29 DIAGNOSIS — J302 Other seasonal allergic rhinitis: Secondary | ICD-10-CM | POA: Diagnosis not present

## 2020-04-29 DIAGNOSIS — E785 Hyperlipidemia, unspecified: Secondary | ICD-10-CM | POA: Diagnosis not present

## 2020-05-02 DIAGNOSIS — L84 Corns and callosities: Secondary | ICD-10-CM | POA: Diagnosis not present

## 2020-05-02 DIAGNOSIS — M2042 Other hammer toe(s) (acquired), left foot: Secondary | ICD-10-CM | POA: Diagnosis not present

## 2020-05-02 DIAGNOSIS — B351 Tinea unguium: Secondary | ICD-10-CM | POA: Diagnosis not present

## 2020-05-02 DIAGNOSIS — S90222A Contusion of left lesser toe(s) with damage to nail, initial encounter: Secondary | ICD-10-CM | POA: Diagnosis not present

## 2020-05-27 DIAGNOSIS — J302 Other seasonal allergic rhinitis: Secondary | ICD-10-CM | POA: Diagnosis not present

## 2020-05-27 DIAGNOSIS — E785 Hyperlipidemia, unspecified: Secondary | ICD-10-CM | POA: Diagnosis not present

## 2020-06-25 DIAGNOSIS — D518 Other vitamin B12 deficiency anemias: Secondary | ICD-10-CM | POA: Diagnosis not present

## 2020-06-25 DIAGNOSIS — E7849 Other hyperlipidemia: Secondary | ICD-10-CM | POA: Diagnosis not present

## 2020-06-25 DIAGNOSIS — E559 Vitamin D deficiency, unspecified: Secondary | ICD-10-CM | POA: Diagnosis not present

## 2020-06-25 DIAGNOSIS — Z79899 Other long term (current) drug therapy: Secondary | ICD-10-CM | POA: Diagnosis not present

## 2020-06-25 DIAGNOSIS — E119 Type 2 diabetes mellitus without complications: Secondary | ICD-10-CM | POA: Diagnosis not present

## 2020-07-01 DIAGNOSIS — J302 Other seasonal allergic rhinitis: Secondary | ICD-10-CM | POA: Diagnosis not present

## 2020-07-01 DIAGNOSIS — E785 Hyperlipidemia, unspecified: Secondary | ICD-10-CM | POA: Diagnosis not present

## 2020-07-02 DIAGNOSIS — U071 COVID-19: Secondary | ICD-10-CM | POA: Diagnosis not present

## 2020-07-24 DIAGNOSIS — E038 Other specified hypothyroidism: Secondary | ICD-10-CM | POA: Diagnosis not present

## 2020-07-24 DIAGNOSIS — E785 Hyperlipidemia, unspecified: Secondary | ICD-10-CM | POA: Diagnosis not present

## 2020-07-24 DIAGNOSIS — D518 Other vitamin B12 deficiency anemias: Secondary | ICD-10-CM | POA: Diagnosis not present

## 2020-07-24 DIAGNOSIS — E119 Type 2 diabetes mellitus without complications: Secondary | ICD-10-CM | POA: Diagnosis not present

## 2020-07-24 DIAGNOSIS — E559 Vitamin D deficiency, unspecified: Secondary | ICD-10-CM | POA: Diagnosis not present

## 2020-07-29 DIAGNOSIS — J302 Other seasonal allergic rhinitis: Secondary | ICD-10-CM | POA: Diagnosis not present

## 2020-07-29 DIAGNOSIS — E785 Hyperlipidemia, unspecified: Secondary | ICD-10-CM | POA: Diagnosis not present

## 2020-08-26 DIAGNOSIS — E785 Hyperlipidemia, unspecified: Secondary | ICD-10-CM | POA: Diagnosis not present

## 2020-08-26 DIAGNOSIS — M2042 Other hammer toe(s) (acquired), left foot: Secondary | ICD-10-CM | POA: Diagnosis not present

## 2020-08-26 DIAGNOSIS — J302 Other seasonal allergic rhinitis: Secondary | ICD-10-CM | POA: Diagnosis not present

## 2020-08-28 DIAGNOSIS — E785 Hyperlipidemia, unspecified: Secondary | ICD-10-CM | POA: Diagnosis not present

## 2020-08-28 DIAGNOSIS — E038 Other specified hypothyroidism: Secondary | ICD-10-CM | POA: Diagnosis not present

## 2020-08-28 DIAGNOSIS — D518 Other vitamin B12 deficiency anemias: Secondary | ICD-10-CM | POA: Diagnosis not present

## 2020-08-28 DIAGNOSIS — E119 Type 2 diabetes mellitus without complications: Secondary | ICD-10-CM | POA: Diagnosis not present

## 2020-08-28 DIAGNOSIS — E559 Vitamin D deficiency, unspecified: Secondary | ICD-10-CM | POA: Diagnosis not present

## 2020-09-19 DIAGNOSIS — B351 Tinea unguium: Secondary | ICD-10-CM | POA: Diagnosis not present

## 2020-09-19 DIAGNOSIS — U071 COVID-19: Secondary | ICD-10-CM | POA: Diagnosis not present

## 2020-09-19 DIAGNOSIS — L84 Corns and callosities: Secondary | ICD-10-CM | POA: Diagnosis not present

## 2020-09-30 DIAGNOSIS — R300 Dysuria: Secondary | ICD-10-CM | POA: Diagnosis not present

## 2020-10-01 DIAGNOSIS — E038 Other specified hypothyroidism: Secondary | ICD-10-CM | POA: Diagnosis not present

## 2020-10-01 DIAGNOSIS — E785 Hyperlipidemia, unspecified: Secondary | ICD-10-CM | POA: Diagnosis not present

## 2020-10-01 DIAGNOSIS — D518 Other vitamin B12 deficiency anemias: Secondary | ICD-10-CM | POA: Diagnosis not present

## 2020-10-01 DIAGNOSIS — J302 Other seasonal allergic rhinitis: Secondary | ICD-10-CM | POA: Diagnosis not present

## 2020-10-01 DIAGNOSIS — E119 Type 2 diabetes mellitus without complications: Secondary | ICD-10-CM | POA: Diagnosis not present

## 2020-10-01 DIAGNOSIS — E559 Vitamin D deficiency, unspecified: Secondary | ICD-10-CM | POA: Diagnosis not present

## 2020-10-22 ENCOUNTER — Other Ambulatory Visit: Payer: Self-pay

## 2020-10-22 ENCOUNTER — Emergency Department (HOSPITAL_COMMUNITY)
Admission: EM | Admit: 2020-10-22 | Discharge: 2020-10-22 | Disposition: A | Discharge status: Home / Self Care | Payer: Medicare Other | Attending: Emergency Medicine | Admitting: Emergency Medicine

## 2020-10-22 ENCOUNTER — Emergency Department (HOSPITAL_COMMUNITY): Payer: Medicare Other

## 2020-10-22 ENCOUNTER — Encounter (HOSPITAL_COMMUNITY): Payer: Self-pay | Admitting: Emergency Medicine

## 2020-10-22 DIAGNOSIS — F0390 Unspecified dementia without behavioral disturbance: Secondary | ICD-10-CM | POA: Diagnosis not present

## 2020-10-22 DIAGNOSIS — R100 Acute abdomen: Secondary | ICD-10-CM | POA: Diagnosis not present

## 2020-10-22 DIAGNOSIS — Z743 Need for continuous supervision: Secondary | ICD-10-CM | POA: Diagnosis not present

## 2020-10-22 DIAGNOSIS — Z79899 Other long term (current) drug therapy: Secondary | ICD-10-CM | POA: Diagnosis not present

## 2020-10-22 DIAGNOSIS — R1084 Generalized abdominal pain: Secondary | ICD-10-CM | POA: Diagnosis not present

## 2020-10-22 DIAGNOSIS — R109 Unspecified abdominal pain: Secondary | ICD-10-CM | POA: Insufficient documentation

## 2020-10-22 DIAGNOSIS — R6889 Other general symptoms and signs: Secondary | ICD-10-CM | POA: Diagnosis not present

## 2020-10-22 DIAGNOSIS — I10 Essential (primary) hypertension: Secondary | ICD-10-CM | POA: Diagnosis not present

## 2020-10-22 DIAGNOSIS — R110 Nausea: Secondary | ICD-10-CM | POA: Diagnosis not present

## 2020-10-22 DIAGNOSIS — I499 Cardiac arrhythmia, unspecified: Secondary | ICD-10-CM | POA: Diagnosis not present

## 2020-10-22 LAB — CBC
HCT: 39.5 % (ref 36.0–46.0)
Hemoglobin: 12.9 g/dL (ref 12.0–15.0)
MCH: 32.2 pg (ref 26.0–34.0)
MCHC: 32.7 g/dL (ref 30.0–36.0)
MCV: 98.5 fL (ref 80.0–100.0)
Platelets: 176 10*3/uL (ref 150–400)
RBC: 4.01 MIL/uL (ref 3.87–5.11)
RDW: 12.9 % (ref 11.5–15.5)
WBC: 7.1 10*3/uL (ref 4.0–10.5)
nRBC: 0 % (ref 0.0–0.2)

## 2020-10-22 LAB — COMPREHENSIVE METABOLIC PANEL
ALT: 14 U/L (ref 0–44)
AST: 23 U/L (ref 15–41)
Albumin: 3.5 g/dL (ref 3.5–5.0)
Alkaline Phosphatase: 57 U/L (ref 38–126)
Anion gap: 10 (ref 5–15)
BUN: 19 mg/dL (ref 8–23)
CO2: 25 mmol/L (ref 22–32)
Calcium: 8.8 mg/dL — ABNORMAL LOW (ref 8.9–10.3)
Chloride: 104 mmol/L (ref 98–111)
Creatinine, Ser: 0.89 mg/dL (ref 0.44–1.00)
GFR, Estimated: >60 mL/min (ref >60)
Glucose, Bld: 86 mg/dL (ref 70–99)
Potassium: 3.7 mmol/L (ref 3.5–5.1)
Sodium: 139 mmol/L (ref 135–145)
Total Bilirubin: 1 mg/dL (ref 0.3–1.2)
Total Protein: 6.4 g/dL — ABNORMAL LOW (ref 6.5–8.1)

## 2020-10-22 LAB — LIPASE, BLOOD: Lipase: 34 U/L (ref 11–51)

## 2020-10-22 NOTE — ED Provider Notes (Signed)
[facility] EMERGENCY DEPARTMENT Provider Note   CSN: [id number] Arrival date & time: 10/22/20  1131     History Chief Complaint  Patient presents with  . Abdominal Pain    Joy Bender is a 70 y.o. female.  Patient w hx dementia, c/o abd pain earlier this AM. Patient limited historian, dementia, level 5 caveat. No vomiting. Had bm this AM. No fever or chills. No dysuria or gu c/o. Denies chest pain or discomfort. No sob. No cough.   The history is provided by the patient. The history is limited by the condition of the patient.  Abdominal Pain      Past Medical History:  Diagnosis Date  . Atypical chest pain    Status post admission in June 2012 ruled out for myocardial infarction status post stress echocardiogram without evidence for ischemia.  . Hyperlipidemia    Lipid panel June 2012 cholesterol 214, triglycerides 144 HDL 40 LDL 145  . Hypertension   . Paroxysmal atrial fibrillation (HCC)    per Cardionet Monitor in 2009 only single episode of a brief run of atrial fibrillation noted on cardiac monitoring 2009 no clinical recurrence.   Marland Kitchen PVC's (premature ventricular contractions)     Patient Active Problem List   Diagnosis Date Noted  . Dementia without behavioral disturbance (HCC) 02/18/2020  . Vaginal irritation from pessary (HCC) 11/01/2019  . Split S1 (first heart sound) 07/17/2014  . GAD (generalized anxiety disorder) 07/17/2014  . Hyperlipidemia   . Hypertension     History reviewed. No pertinent surgical history.   OB History    Gravida  3   Para  3   Term      Preterm      AB      Living  3     SAB      IAB      Ectopic      Multiple      Live Births              Family History  Problem Relation Age of Onset  . Cancer Brother   . Cancer Son        liver    Social History   Tobacco Use  . Smoking status: Never Smoker  . Smokeless tobacco: Never Used  Vaping Use  . Vaping Use: Never used  Substance Use Topics  .  Alcohol use: No    Alcohol/week: 0.0 standard drinks  . Drug use: No    Home Medications Prior to Admission medications   Medication Sig Start Date End Date Taking? Authorizing Provider  loratadine (CLARITIN) 10 MG tablet Take 10 mg by mouth daily.    [provider]  sertraline (ZOLOFT) 50 MG tablet Take 1 tablet (50 mg total) by mouth daily. 08/01/19   Dettinger, Elige Radon, MD    Allergies    Patient has no known allergies.  Review of Systems   Review of Systems  Unable to perform ROS: Dementia  Gastrointestinal: Positive for abdominal pain.  level 5 caveat - dementia  Physical Exam Updated Vital Signs BP 90/78 (BP Location: Right Arm)   Pulse 82   Temp 98 F (36.7 C) (Oral)   Resp 20   Ht 1.6 m (5\' 3" )   Wt 64 kg   SpO2 100%   BMI 24.99 kg/m   Physical Exam Vitals and nursing note reviewed.  Constitutional:      Appearance: Normal appearance. She is well-developed.  HENT:  Head: Atraumatic.     Nose: Nose normal.     Mouth/Throat:     Mouth: Mucous membranes are moist.  Eyes:     General: No scleral icterus.    Conjunctiva/sclera: Conjunctivae normal.  Neck:     Trachea: No tracheal deviation.  Cardiovascular:     Rate and Rhythm: Normal rate and regular rhythm.     Pulses: Normal pulses.     Heart sounds: Normal heart sounds. No murmur heard. No friction rub. No gallop.   Pulmonary:     Effort: Pulmonary effort is normal. No respiratory distress.     Breath sounds: Normal breath sounds.  Abdominal:     General: Bowel sounds are normal. There is no distension.     Palpations: Abdomen is soft. There is no mass.     Tenderness: There is no abdominal tenderness. There is no guarding.  Genitourinary:    Comments: No cva tenderness.  Musculoskeletal:        General: No swelling.     Cervical back: Normal range of motion and neck supple. No rigidity. No muscular tenderness.  Skin:    General: Skin is warm and dry.     Findings: No rash.   Neurological:     Mental Status: She is alert.     Comments: Alert, speech normal.   Psychiatric:        Mood and Affect: Mood normal.     ED Results / Procedures / Treatments   Labs (all labs ordered are listed, but only abnormal results are displayed) Results for orders placed or performed during the hospital encounter of 10/22/20  CBC  Result Value Ref Range   WBC 7.1 4.0 - 10.5 K/uL   RBC 4.01 3.87 - 5.11 MIL/uL   Hemoglobin 12.9 12.0 - 15.0 g/dL   HCT 72.5 36.6 - 44.0 %   MCV 98.5 80.0 - 100.0 fL   MCH 32.2 26.0 - 34.0 pg   MCHC 32.7 30.0 - 36.0 g/dL   RDW 34.7 42.5 - 95.6 %   Platelets 176 150 - 400 K/uL   nRBC 0.0 0.0 - 0.2 %  Comprehensive metabolic panel  Result Value Ref Range   Sodium 139 135 - 145 mmol/L   Potassium 3.7 3.5 - 5.1 mmol/L   Chloride 104 98 - 111 mmol/L   CO2 25 22 - 32 mmol/L   Glucose, Bld 86 70 - 99 mg/dL   BUN 19 8 - 23 mg/dL   Creatinine, Ser 3.87 0.44 - 1.00 mg/dL   Calcium 8.8 (L) 8.9 - 10.3 mg/dL   Total Protein 6.4 (L) 6.5 - 8.1 g/dL   Albumin 3.5 3.5 - 5.0 g/dL   AST 23 15 - 41 U/L   ALT 14 0 - 44 U/L   Alkaline Phosphatase 57 38 - 126 U/L   Total Bilirubin 1.0 0.3 - 1.2 mg/dL   GFR, Estimated >56 >43 mL/min   Anion gap 10 5 - 15  Lipase, blood  Result Value Ref Range   Lipase 34 11 - 51 U/L   DG Abd 1 View  Result Date: 10/22/2020 CLINICAL DATA:  Pt arrived by [facility] from [place] [facility] for abd pain that started about an hour ago. Hx dementia. Limited Hx. Pt extremely confused and agitated during imaging EXAM: ABDOMEN - 1 VIEW COMPARISON:  None. FINDINGS: Moderate increase in the colonic stool burden. No bowel dilation to suggest obstruction. Rim calcification in the left upper quadrant which may reflect a splenic  artery aneurysm. This measures approximately 1.5 cm in size. No evidence of renal or ureteral stones. Aortoiliac atherosclerotic vascular calcifications. Soft tissues otherwise unremarkable. Clear lung bases. No acute or  significant skeletal abnormality. IMPRESSION: 1. No acute findings. 2. Moderate increase in colonic stool burden. 3. Possible peripherally calcified splenic artery aneurysm. This could be further assessed, if desired clinically, with abdominal CT. Electronically Signed   By: Amie Portland M.D.   On: 10/22/2020 12:43    EKG None  Radiology No results found.  Procedures Procedures   Medications Ordered in ED Medications - No data to display  ED Course  I have reviewed the triage vital signs and the nursing notes.  Pertinent labs & imaging results that were available during my care of the patient were reviewed by me and considered in my medical decision making (see chart for details).    MDM Rules/Calculators/A&P                         Labs sent. xrays ordered.   Reviewed nursing notes and prior charts for additional history.   Labs reviewed/interpreted by me - chem normal. Wbc normal.  Xrays reviewed/interpreted by me-  No obstruction.  On recheck, pt is afebrile, patient has no abd pain, no tenderness on exam. Afebrile.   Pt currently is symptom free, ambulatory about ED, and appears stable for d/c.   Rec pcp f/u.  Return precautions provided.      Final Clinical Impression(s) / ED Diagnoses Final diagnoses:  None    Rx / DC Orders ED Discharge Orders    None       Cathren Laine, MD 10/22/20 1353

## 2020-10-22 NOTE — Discharge Instructions (Addendum)
It was our pleasure to provide your ER care today - we hope that you feel better.  If constipated, try taking colace 2x/day as need (stool softener), and miralax once a day as need (laxative).   Return to ER if worse, new symptoms, fevers, abdominal pain, persistent vomiting, trouble breathing, or other concern.

## 2020-10-22 NOTE — ED Notes (Signed)
Due to confusion pt unable to sign MSE waiver.

## 2020-10-22 NOTE — ED Triage Notes (Signed)
Pt arrived by [facility] from [place] for abd pain that started about an hour ago. Pt had 1 BM and no has no complaints. Facility dr still wanted pt transported to ED for CT of abd.

## 2020-10-29 DIAGNOSIS — J302 Other seasonal allergic rhinitis: Secondary | ICD-10-CM | POA: Diagnosis not present

## 2020-10-29 DIAGNOSIS — E785 Hyperlipidemia, unspecified: Secondary | ICD-10-CM | POA: Diagnosis not present

## 2020-11-03 DIAGNOSIS — E119 Type 2 diabetes mellitus without complications: Secondary | ICD-10-CM | POA: Diagnosis not present

## 2020-11-03 DIAGNOSIS — E038 Other specified hypothyroidism: Secondary | ICD-10-CM | POA: Diagnosis not present

## 2020-11-03 DIAGNOSIS — E559 Vitamin D deficiency, unspecified: Secondary | ICD-10-CM | POA: Diagnosis not present

## 2020-11-03 DIAGNOSIS — D518 Other vitamin B12 deficiency anemias: Secondary | ICD-10-CM | POA: Diagnosis not present

## 2020-11-03 DIAGNOSIS — E785 Hyperlipidemia, unspecified: Secondary | ICD-10-CM | POA: Diagnosis not present

## 2020-11-25 DIAGNOSIS — D518 Other vitamin B12 deficiency anemias: Secondary | ICD-10-CM | POA: Diagnosis not present

## 2020-11-25 DIAGNOSIS — E785 Hyperlipidemia, unspecified: Secondary | ICD-10-CM | POA: Diagnosis not present

## 2020-11-25 DIAGNOSIS — E559 Vitamin D deficiency, unspecified: Secondary | ICD-10-CM | POA: Diagnosis not present

## 2020-11-25 DIAGNOSIS — E119 Type 2 diabetes mellitus without complications: Secondary | ICD-10-CM | POA: Diagnosis not present

## 2020-11-25 DIAGNOSIS — E038 Other specified hypothyroidism: Secondary | ICD-10-CM | POA: Diagnosis not present

## 2020-11-26 DIAGNOSIS — E785 Hyperlipidemia, unspecified: Secondary | ICD-10-CM | POA: Diagnosis not present

## 2020-11-26 DIAGNOSIS — J302 Other seasonal allergic rhinitis: Secondary | ICD-10-CM | POA: Diagnosis not present

## 2020-12-13 DIAGNOSIS — R300 Dysuria: Secondary | ICD-10-CM | POA: Diagnosis not present

## 2020-12-15 DIAGNOSIS — R300 Dysuria: Secondary | ICD-10-CM | POA: Diagnosis not present

## 2020-12-18 DIAGNOSIS — E785 Hyperlipidemia, unspecified: Secondary | ICD-10-CM | POA: Diagnosis not present

## 2020-12-18 DIAGNOSIS — E119 Type 2 diabetes mellitus without complications: Secondary | ICD-10-CM | POA: Diagnosis not present

## 2020-12-18 DIAGNOSIS — D518 Other vitamin B12 deficiency anemias: Secondary | ICD-10-CM | POA: Diagnosis not present

## 2020-12-18 DIAGNOSIS — E559 Vitamin D deficiency, unspecified: Secondary | ICD-10-CM | POA: Diagnosis not present

## 2020-12-18 DIAGNOSIS — I10 Essential (primary) hypertension: Secondary | ICD-10-CM | POA: Diagnosis not present

## 2020-12-18 DIAGNOSIS — E038 Other specified hypothyroidism: Secondary | ICD-10-CM | POA: Diagnosis not present

## 2020-12-24 DIAGNOSIS — J302 Other seasonal allergic rhinitis: Secondary | ICD-10-CM | POA: Diagnosis not present

## 2020-12-24 DIAGNOSIS — E785 Hyperlipidemia, unspecified: Secondary | ICD-10-CM | POA: Diagnosis not present

## 2020-12-24 DIAGNOSIS — F32A Depression, unspecified: Secondary | ICD-10-CM | POA: Diagnosis not present

## 2020-12-24 DIAGNOSIS — I10 Essential (primary) hypertension: Secondary | ICD-10-CM | POA: Diagnosis not present

## 2020-12-24 DIAGNOSIS — M2042 Other hammer toe(s) (acquired), left foot: Secondary | ICD-10-CM | POA: Diagnosis not present

## 2021-01-01 ENCOUNTER — Other Ambulatory Visit: Payer: Self-pay

## 2021-01-01 ENCOUNTER — Emergency Department (HOSPITAL_COMMUNITY)
Admission: EM | Admit: 2021-01-01 | Discharge: 2021-01-02 | Disposition: A | Discharge status: Home / Self Care | Payer: Medicare Other | Attending: Emergency Medicine | Admitting: Emergency Medicine

## 2021-01-01 ENCOUNTER — Encounter (HOSPITAL_COMMUNITY): Payer: Self-pay | Admitting: Emergency Medicine

## 2021-01-01 ENCOUNTER — Emergency Department (HOSPITAL_COMMUNITY): Payer: Medicare Other

## 2021-01-01 DIAGNOSIS — S0083XA Contusion of other part of head, initial encounter: Secondary | ICD-10-CM

## 2021-01-01 DIAGNOSIS — M7981 Nontraumatic hematoma of soft tissue: Secondary | ICD-10-CM | POA: Diagnosis not present

## 2021-01-01 DIAGNOSIS — Y92129 Unspecified place in nursing home as the place of occurrence of the external cause: Secondary | ICD-10-CM

## 2021-01-01 DIAGNOSIS — S80212A Abrasion, left knee, initial encounter: Secondary | ICD-10-CM | POA: Insufficient documentation

## 2021-01-01 DIAGNOSIS — I10 Essential (primary) hypertension: Secondary | ICD-10-CM | POA: Diagnosis not present

## 2021-01-01 DIAGNOSIS — S0033XA Contusion of nose, initial encounter: Secondary | ICD-10-CM | POA: Insufficient documentation

## 2021-01-01 DIAGNOSIS — Y92128 Other place in nursing home as the place of occurrence of the external cause: Secondary | ICD-10-CM | POA: Diagnosis not present

## 2021-01-01 DIAGNOSIS — R58 Hemorrhage, not elsewhere classified: Secondary | ICD-10-CM | POA: Diagnosis not present

## 2021-01-01 DIAGNOSIS — S0990XA Unspecified injury of head, initial encounter: Secondary | ICD-10-CM | POA: Diagnosis present

## 2021-01-01 DIAGNOSIS — F0390 Unspecified dementia without behavioral disturbance: Secondary | ICD-10-CM | POA: Insufficient documentation

## 2021-01-01 DIAGNOSIS — S80211A Abrasion, right knee, initial encounter: Secondary | ICD-10-CM | POA: Diagnosis not present

## 2021-01-01 DIAGNOSIS — W19XXXA Unspecified fall, initial encounter: Secondary | ICD-10-CM | POA: Insufficient documentation

## 2021-01-01 DIAGNOSIS — I6523 Occlusion and stenosis of bilateral carotid arteries: Secondary | ICD-10-CM | POA: Diagnosis not present

## 2021-01-01 DIAGNOSIS — Z743 Need for continuous supervision: Secondary | ICD-10-CM | POA: Diagnosis not present

## 2021-01-01 DIAGNOSIS — R296 Repeated falls: Secondary | ICD-10-CM | POA: Diagnosis not present

## 2021-01-01 DIAGNOSIS — S0011XA Contusion of right eyelid and periocular area, initial encounter: Secondary | ICD-10-CM | POA: Diagnosis not present

## 2021-01-01 DIAGNOSIS — S0511XA Contusion of eyeball and orbital tissues, right eye, initial encounter: Secondary | ICD-10-CM | POA: Diagnosis not present

## 2021-01-01 DIAGNOSIS — S199XXA Unspecified injury of neck, initial encounter: Secondary | ICD-10-CM | POA: Diagnosis not present

## 2021-01-01 HISTORY — DX: Unspecified dementia, unspecified severity, without behavioral disturbance, psychotic disturbance, mood disturbance, and anxiety: F03.90

## 2021-01-01 NOTE — ED Triage Notes (Signed)
[facility] - pt from [facility]. Pt had unwitnessed fall. Pt has hematoma to right eye, abrasion to nose, and abrasion to bilateral knees. Pt has hx of dementia

## 2021-01-02 ENCOUNTER — Emergency Department (HOSPITAL_COMMUNITY): Payer: Medicare Other

## 2021-01-02 DIAGNOSIS — W19XXXA Unspecified fall, initial encounter: Secondary | ICD-10-CM | POA: Diagnosis not present

## 2021-01-02 DIAGNOSIS — Z743 Need for continuous supervision: Secondary | ICD-10-CM | POA: Diagnosis not present

## 2021-01-02 DIAGNOSIS — S199XXA Unspecified injury of neck, initial encounter: Secondary | ICD-10-CM | POA: Diagnosis not present

## 2021-01-02 DIAGNOSIS — S0083XA Contusion of other part of head, initial encounter: Secondary | ICD-10-CM | POA: Diagnosis not present

## 2021-01-02 DIAGNOSIS — Z7401 Bed confinement status: Secondary | ICD-10-CM | POA: Diagnosis not present

## 2021-01-02 DIAGNOSIS — S0511XA Contusion of eyeball and orbital tissues, right eye, initial encounter: Secondary | ICD-10-CM | POA: Diagnosis not present

## 2021-01-02 DIAGNOSIS — M7981 Nontraumatic hematoma of soft tissue: Secondary | ICD-10-CM | POA: Diagnosis not present

## 2021-01-02 DIAGNOSIS — S0033XA Contusion of nose, initial encounter: Secondary | ICD-10-CM | POA: Diagnosis not present

## 2021-01-02 DIAGNOSIS — R404 Transient alteration of awareness: Secondary | ICD-10-CM | POA: Diagnosis not present

## 2021-01-02 DIAGNOSIS — I6523 Occlusion and stenosis of bilateral carotid arteries: Secondary | ICD-10-CM | POA: Diagnosis not present

## 2021-01-02 MED ORDER — HALOPERIDOL LACTATE 5 MG/ML IJ SOLN: 2.0000 mg | Freq: Once | INTRAMUSCULAR | Status: AC

## 2021-01-02 MED ORDER — HALOPERIDOL LACTATE 5 MG/ML IJ SOLN
INTRAMUSCULAR | Status: AC
  Administered 2021-01-02: 2 mg via INTRAMUSCULAR
  Filled 2021-01-02: qty 1

## 2021-01-02 MED ORDER — ACETAMINOPHEN 325 MG PO TABS
650.0000 mg | ORAL_TABLET | Freq: Once | ORAL | Status: AC
  Administered 2021-01-02: 650 mg via ORAL
  Filled 2021-01-02: qty 2

## 2021-01-02 NOTE — ED Notes (Signed)
Verlon Au, RN attempted calling [facility] for report x 3 with no answer.

## 2021-01-02 NOTE — ED Provider Notes (Signed)
[facility] EMERGENCY DEPARTMENT Provider Note   CSN: [id number] Arrival date & time: 01/01/21  2207     History Chief Complaint  Patient presents with   Joy Bender    Joy Bender is a 70 y.o. female.  The history is provided by the nursing home. The history is limited by the condition of the patient (Dementia).  Fall She has a history of hypertension, hyperlipidemia, paroxysmal atrial fibrillation but apparently not on anticoagulation and comes in from a skilled nursing facility following an unwitnessed fall.  She was observed to have right periorbital ecchymosis and bruising to her nose and abrasions to her knees.   Past Medical History:  Diagnosis Date   Atypical chest pain    Status post admission in June 2012 ruled out for myocardial infarction status post stress echocardiogram without evidence for ischemia.   Dementia (HCC)    Hyperlipidemia    Lipid panel June 2012 cholesterol 214, triglycerides 144 HDL 40 LDL 145   Hypertension    Paroxysmal atrial fibrillation (HCC)    per Cardionet Monitor in 2009 only single episode of a brief run of atrial fibrillation noted on cardiac monitoring 2009 no clinical recurrence.    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 No pertinent surgical history.   OB History     Gravida  3   Para  3   Term      Preterm      AB      Living  3      SAB      IAB      Ectopic      Multiple      Live Births              Family History  Problem Relation Age of Onset   Cancer Brother    Cancer Son        liver    Social History   Tobacco Use   Smoking status: Never   Smokeless tobacco: Never  Vaping  Use   Vaping Use: Never used  Substance Use Topics   Alcohol use: No    Alcohol/week: 0.0 standard drinks   Drug use: No    Home Medications Prior to Admission medications   Medication Sig Start Date End Date Taking? Authorizing Provider  loratadine (CLARITIN) 10 MG tablet Take 10 mg by mouth daily.    [provider]  sertraline (ZOLOFT) 50 MG tablet Take 1 tablet (50 mg total) by mouth daily. 08/01/19   Dettinger, Elige Radon, MD    Allergies    Patient has no known allergies.  Review of Systems   Review of Systems  Unable to perform ROS: Dementia   Physical Exam Updated Vital Signs BP (!) 145/90   Pulse 76   Temp 98 F (36.7 C)   Resp 18   Ht 5\' 3"  (1.6 m)   Wt 64 kg   SpO2 99%   BMI 24.99 kg/m   Physical Exam Vitals and nursing note reviewed.  70 year old female, resting comfortably and in no acute distress. Vital signs are significant for mildly elevated blood pressure. Oxygen saturation is 99%, which is normal.  Head is normocephalic.  Right periorbital ecchymosis noted with swelling over the right malar area.  Mild swelling noted over the bridge of the nose. PERRLA, EOMI. Oropharynx is clear. Neck is nontender without adenopathy or JVD. Back is nontender and there is no CVA tenderness. Lungs are clear without rales, wheezes, or rhonchi. Chest is nontender. Heart has regular rate and rhythm without murmur. Abdomen is soft, flat, nontender without masses or hepatosplenomegaly and peristalsis is normoactive. Extremities have no cyanosis or edema, full range of motion is present.  Minor abrasions present on both knees anteriorly. Skin is warm and dry without rash. Neurologic: Awake and alert, oriented to person and place but not time, cranial nerves are intact, moves all extremities equally.  ED Results / Procedures / Treatments    Radiology CT Head Wo Contrast  Result Date: 01/01/2021 CLINICAL DATA:  Facial trauma. EXAM: CT HEAD WITHOUT CONTRAST  TECHNIQUE: Contiguous axial images were obtained from the base of the skull through the vertex without intravenous contrast. COMPARISON:  None. FINDINGS: Brain: Patchy and confluent areas of decreased attenuation are noted throughout the deep and periventricular white matter of the cerebral hemispheres bilaterally, compatible with chronic microvascular ischemic disease. No evidence of large-territorial acute infarction. No parenchymal hemorrhage. No mass lesion. No extra-axial collection. No mass effect or midline shift. No hydrocephalus. Basilar cisterns are patent. Vascular: No hyperdense vessel. Atherosclerotic calcifications are present within the cavernous internal carotid arteries. Skull: No acute fracture or focal lesion. Sinuses/Orbits: Paranasal sinuses and mastoid air cells are clear. The orbits are unremarkable. Other: Right periorbital subcutaneus soft tissue edema and hematoma formation.5. IMPRESSION: No acute intracranial abnormality. Electronically Signed   By: Tish Frederickson M.D.   On: 01/01/2021 23:03   CT Cervical Spine Wo Contrast  Result Date: 01/02/2021 CLINICAL DATA:  Trauma.  Right eye swollen. EXAM: CT MAXILLOFACIAL WITHOUT CONTRAST; CT CERVICAL SPINE WITHOUT CONTRAST TECHNIQUE: Multidetector CT imaging of the maxillofacial structures was performed. Multiplanar CT image reconstructions were also generated. Multidetector CT imaging of the cervical spine structures was performed. Multiplanar CT image reconstructions were also generated. COMPARISON:  None. FINDINGS: CT MAX FACE: Osseous: No fracture or mandibular dislocation. No destructive process. Orbits: Negative. No traumatic or inflammatory finding. Sinuses: Clear. Soft tissues: Right periorbital subcutaneus soft tissue edema and hematoma formation. Edema tracks along the right maxillary soft tissues. Limited intracranial: Please see separately dictated CT head 01/01/2021. CT C SPINE: AligNment: Normal. Skull base and vertebrae: No  acute fracture. No aggressive appearing focal osseous lesion or focal pathologic process. Soft tissues and spinal canal: No prevertebral fluid or swelling. No visible canal hematoma. Upper chest: Unremarkable. Other: At least moderate atherosclerotic plaque of the carotid arteries within the neck. IMPRESSION: 1. No acute displaced facial fracture. 2. No acute displaced fracture or traumatic listhesis of the cervical spine. 3. Please see separately dictated CT head 01/01/2021. Electronically Signed   By: Tish Frederickson M.D.   On: 01/02/2021 02:15   CT Maxillofacial Wo Contrast  Result Date: 01/02/2021 CLINICAL DATA:  Trauma.  Right eye swollen. EXAM: CT MAXILLOFACIAL WITHOUT CONTRAST; CT CERVICAL SPINE WITHOUT CONTRAST TECHNIQUE: Multidetector CT imaging of the maxillofacial structures was performed. Multiplanar CT image reconstructions were also generated. Multidetector CT imaging of the cervical spine structures was performed. Multiplanar CT image reconstructions were also generated. COMPARISON:  None. FINDINGS: CT MAX FACE: Osseous: No fracture or mandibular dislocation. No destructive process. Orbits: Negative. No traumatic or inflammatory finding. Sinuses: Clear. Soft tissues: Right periorbital subcutaneus soft tissue edema  and hematoma formation. Edema tracks along the right maxillary soft tissues. Limited intracranial: Please see separately dictated CT head 01/01/2021. CT C SPINE: AligNment: Normal. Skull base and vertebrae: No acute fracture. No aggressive appearing focal osseous lesion or focal pathologic process. Soft tissues and spinal canal: No prevertebral fluid or swelling. No visible canal hematoma. Upper chest: Unremarkable. Other: At least moderate atherosclerotic plaque of the carotid arteries within the neck. IMPRESSION: 1. No acute displaced facial fracture. 2. No acute displaced fracture or traumatic listhesis of the cervical spine. 3. Please see separately dictated CT head 01/01/2021.  Electronically Signed   By: Tish Frederickson M.D.   On: 01/02/2021 02:15    Procedures Procedures   Medications Ordered in ED Medications - No data to display  ED Course  I have reviewed the triage vital signs and the nursing notes.  Pertinent imaging results that were available during my care of the patient were reviewed by me and considered in my medical decision making (see chart for details).   MDM Rules/Calculators/A&P                         Unwitnessed fall with abrasion to knees and facial trauma.  She was sent for CT of head which shows no acute intracranial abnormality.  She will be sent for additional CT scans of maxillofacial bones and cervical spine.  Old records are reviewed, and she has no relevant past visits.  Additional CT scan showed evidence of bruising but no fracture or serious injury.  She is discharged back to her skilled nursing facility with instructions to apply ice, use over-the-counter analgesics as needed for pain.  Final Clinical Impression(s) / ED Diagnoses Final diagnoses:  Fall at nursing home, initial encounter  Contusion of face, initial encounter    Rx / DC Orders ED Discharge Orders     None        Dione Booze, MD 01/02/21 0410

## 2021-01-02 NOTE — ED Notes (Signed)
Pt ambulated around nurse's station with standby assistance.

## 2021-01-14 DIAGNOSIS — R296 Repeated falls: Secondary | ICD-10-CM | POA: Diagnosis not present

## 2021-01-14 DIAGNOSIS — M6281 Muscle weakness (generalized): Secondary | ICD-10-CM | POA: Diagnosis not present

## 2021-01-14 DIAGNOSIS — R2681 Unsteadiness on feet: Secondary | ICD-10-CM | POA: Diagnosis not present

## 2021-01-14 DIAGNOSIS — I10 Essential (primary) hypertension: Secondary | ICD-10-CM | POA: Diagnosis not present

## 2021-01-14 DIAGNOSIS — R262 Difficulty in walking, not elsewhere classified: Secondary | ICD-10-CM | POA: Diagnosis not present

## 2021-01-16 DIAGNOSIS — M6281 Muscle weakness (generalized): Secondary | ICD-10-CM | POA: Diagnosis not present

## 2021-01-16 DIAGNOSIS — R2681 Unsteadiness on feet: Secondary | ICD-10-CM | POA: Diagnosis not present

## 2021-01-16 DIAGNOSIS — R296 Repeated falls: Secondary | ICD-10-CM | POA: Diagnosis not present

## 2021-01-16 DIAGNOSIS — R262 Difficulty in walking, not elsewhere classified: Secondary | ICD-10-CM | POA: Diagnosis not present

## 2021-01-16 DIAGNOSIS — I10 Essential (primary) hypertension: Secondary | ICD-10-CM | POA: Diagnosis not present

## 2021-01-20 DIAGNOSIS — I10 Essential (primary) hypertension: Secondary | ICD-10-CM | POA: Diagnosis not present

## 2021-01-20 DIAGNOSIS — J302 Other seasonal allergic rhinitis: Secondary | ICD-10-CM | POA: Diagnosis not present

## 2021-01-20 DIAGNOSIS — E785 Hyperlipidemia, unspecified: Secondary | ICD-10-CM | POA: Diagnosis not present

## 2021-01-26 DIAGNOSIS — I10 Essential (primary) hypertension: Secondary | ICD-10-CM | POA: Diagnosis not present

## 2021-01-26 DIAGNOSIS — E038 Other specified hypothyroidism: Secondary | ICD-10-CM | POA: Diagnosis not present

## 2021-01-26 DIAGNOSIS — D518 Other vitamin B12 deficiency anemias: Secondary | ICD-10-CM | POA: Diagnosis not present

## 2021-01-26 DIAGNOSIS — Z79899 Other long term (current) drug therapy: Secondary | ICD-10-CM | POA: Diagnosis not present

## 2021-01-26 DIAGNOSIS — E785 Hyperlipidemia, unspecified: Secondary | ICD-10-CM | POA: Diagnosis not present

## 2021-01-26 DIAGNOSIS — E559 Vitamin D deficiency, unspecified: Secondary | ICD-10-CM | POA: Diagnosis not present

## 2021-01-26 DIAGNOSIS — E7849 Other hyperlipidemia: Secondary | ICD-10-CM | POA: Diagnosis not present

## 2021-01-26 DIAGNOSIS — E119 Type 2 diabetes mellitus without complications: Secondary | ICD-10-CM | POA: Diagnosis not present

## 2021-02-03 DIAGNOSIS — R296 Repeated falls: Secondary | ICD-10-CM | POA: Diagnosis not present

## 2021-02-03 DIAGNOSIS — S0083XA Contusion of other part of head, initial encounter: Secondary | ICD-10-CM | POA: Diagnosis not present

## 2021-02-17 DIAGNOSIS — R011 Cardiac murmur, unspecified: Secondary | ICD-10-CM | POA: Diagnosis not present

## 2021-02-17 DIAGNOSIS — I10 Essential (primary) hypertension: Secondary | ICD-10-CM | POA: Diagnosis not present

## 2021-02-17 DIAGNOSIS — E785 Hyperlipidemia, unspecified: Secondary | ICD-10-CM | POA: Diagnosis not present

## 2021-02-17 DIAGNOSIS — J302 Other seasonal allergic rhinitis: Secondary | ICD-10-CM | POA: Diagnosis not present

## 2021-02-19 DIAGNOSIS — I10 Essential (primary) hypertension: Secondary | ICD-10-CM | POA: Diagnosis not present

## 2021-02-19 DIAGNOSIS — E038 Other specified hypothyroidism: Secondary | ICD-10-CM | POA: Diagnosis not present

## 2021-02-19 DIAGNOSIS — E559 Vitamin D deficiency, unspecified: Secondary | ICD-10-CM | POA: Diagnosis not present

## 2021-02-19 DIAGNOSIS — E785 Hyperlipidemia, unspecified: Secondary | ICD-10-CM | POA: Diagnosis not present

## 2021-02-19 DIAGNOSIS — D518 Other vitamin B12 deficiency anemias: Secondary | ICD-10-CM | POA: Diagnosis not present

## 2021-02-19 DIAGNOSIS — E119 Type 2 diabetes mellitus without complications: Secondary | ICD-10-CM | POA: Diagnosis not present

## 2021-03-17 DIAGNOSIS — J302 Other seasonal allergic rhinitis: Secondary | ICD-10-CM | POA: Diagnosis not present

## 2021-03-17 DIAGNOSIS — F32A Depression, unspecified: Secondary | ICD-10-CM | POA: Diagnosis not present

## 2021-03-17 DIAGNOSIS — E785 Hyperlipidemia, unspecified: Secondary | ICD-10-CM | POA: Diagnosis not present

## 2021-03-17 DIAGNOSIS — I10 Essential (primary) hypertension: Secondary | ICD-10-CM | POA: Diagnosis not present

## 2021-03-30 DIAGNOSIS — I10 Essential (primary) hypertension: Secondary | ICD-10-CM | POA: Diagnosis not present

## 2021-03-30 DIAGNOSIS — D518 Other vitamin B12 deficiency anemias: Secondary | ICD-10-CM | POA: Diagnosis not present

## 2021-03-30 DIAGNOSIS — E119 Type 2 diabetes mellitus without complications: Secondary | ICD-10-CM | POA: Diagnosis not present

## 2021-03-30 DIAGNOSIS — E559 Vitamin D deficiency, unspecified: Secondary | ICD-10-CM | POA: Diagnosis not present

## 2021-03-30 DIAGNOSIS — E785 Hyperlipidemia, unspecified: Secondary | ICD-10-CM | POA: Diagnosis not present

## 2021-03-30 DIAGNOSIS — E038 Other specified hypothyroidism: Secondary | ICD-10-CM | POA: Diagnosis not present

## 2021-04-14 DIAGNOSIS — E785 Hyperlipidemia, unspecified: Secondary | ICD-10-CM | POA: Diagnosis not present

## 2021-04-14 DIAGNOSIS — J302 Other seasonal allergic rhinitis: Secondary | ICD-10-CM | POA: Diagnosis not present

## 2021-04-14 DIAGNOSIS — E559 Vitamin D deficiency, unspecified: Secondary | ICD-10-CM | POA: Diagnosis not present

## 2021-04-14 DIAGNOSIS — E538 Deficiency of other specified B group vitamins: Secondary | ICD-10-CM | POA: Diagnosis not present

## 2021-04-14 DIAGNOSIS — I10 Essential (primary) hypertension: Secondary | ICD-10-CM | POA: Diagnosis not present

## 2021-04-21 DIAGNOSIS — E785 Hyperlipidemia, unspecified: Secondary | ICD-10-CM | POA: Diagnosis not present

## 2021-04-21 DIAGNOSIS — E559 Vitamin D deficiency, unspecified: Secondary | ICD-10-CM | POA: Diagnosis not present

## 2021-04-21 DIAGNOSIS — D518 Other vitamin B12 deficiency anemias: Secondary | ICD-10-CM | POA: Diagnosis not present

## 2021-04-21 DIAGNOSIS — E782 Mixed hyperlipidemia: Secondary | ICD-10-CM | POA: Diagnosis not present

## 2021-04-21 DIAGNOSIS — Z79899 Other long term (current) drug therapy: Secondary | ICD-10-CM | POA: Diagnosis not present

## 2021-04-21 DIAGNOSIS — E119 Type 2 diabetes mellitus without complications: Secondary | ICD-10-CM | POA: Diagnosis not present

## 2021-04-22 DIAGNOSIS — E559 Vitamin D deficiency, unspecified: Secondary | ICD-10-CM | POA: Diagnosis not present

## 2021-04-22 DIAGNOSIS — I10 Essential (primary) hypertension: Secondary | ICD-10-CM | POA: Diagnosis not present

## 2021-04-22 DIAGNOSIS — D518 Other vitamin B12 deficiency anemias: Secondary | ICD-10-CM | POA: Diagnosis not present

## 2021-04-22 DIAGNOSIS — E785 Hyperlipidemia, unspecified: Secondary | ICD-10-CM | POA: Diagnosis not present

## 2021-04-22 DIAGNOSIS — E038 Other specified hypothyroidism: Secondary | ICD-10-CM | POA: Diagnosis not present

## 2021-04-22 DIAGNOSIS — E119 Type 2 diabetes mellitus without complications: Secondary | ICD-10-CM | POA: Diagnosis not present

## 2021-05-05 DIAGNOSIS — R296 Repeated falls: Secondary | ICD-10-CM | POA: Diagnosis not present

## 2021-05-06 DIAGNOSIS — I10 Essential (primary) hypertension: Secondary | ICD-10-CM | POA: Diagnosis not present

## 2021-05-07 DIAGNOSIS — E119 Type 2 diabetes mellitus without complications: Secondary | ICD-10-CM | POA: Diagnosis not present

## 2021-05-07 DIAGNOSIS — D518 Other vitamin B12 deficiency anemias: Secondary | ICD-10-CM | POA: Diagnosis not present

## 2021-05-07 DIAGNOSIS — E7849 Other hyperlipidemia: Secondary | ICD-10-CM | POA: Diagnosis not present

## 2021-05-07 DIAGNOSIS — Z79899 Other long term (current) drug therapy: Secondary | ICD-10-CM | POA: Diagnosis not present

## 2021-05-08 ENCOUNTER — Encounter (HOSPITAL_COMMUNITY): Payer: Self-pay | Admitting: Emergency Medicine

## 2021-05-08 ENCOUNTER — Other Ambulatory Visit: Payer: Self-pay

## 2021-05-08 ENCOUNTER — Emergency Department (HOSPITAL_COMMUNITY): Payer: Medicare Other

## 2021-05-08 ENCOUNTER — Emergency Department (HOSPITAL_COMMUNITY)
Admission: EM | Admit: 2021-05-08 | Discharge: 2021-05-08 | Disposition: A | Discharge status: Home / Self Care | Payer: Medicare Other | Attending: Emergency Medicine | Admitting: Emergency Medicine

## 2021-05-08 DIAGNOSIS — I10 Essential (primary) hypertension: Secondary | ICD-10-CM | POA: Insufficient documentation

## 2021-05-08 DIAGNOSIS — Z743 Need for continuous supervision: Secondary | ICD-10-CM | POA: Diagnosis not present

## 2021-05-08 DIAGNOSIS — F0390 Unspecified dementia without behavioral disturbance: Secondary | ICD-10-CM | POA: Diagnosis not present

## 2021-05-08 DIAGNOSIS — R5383 Other fatigue: Secondary | ICD-10-CM | POA: Insufficient documentation

## 2021-05-08 DIAGNOSIS — Z79899 Other long term (current) drug therapy: Secondary | ICD-10-CM | POA: Insufficient documentation

## 2021-05-08 DIAGNOSIS — R531 Weakness: Secondary | ICD-10-CM | POA: Diagnosis not present

## 2021-05-08 DIAGNOSIS — R4182 Altered mental status, unspecified: Secondary | ICD-10-CM | POA: Diagnosis not present

## 2021-05-08 LAB — URINALYSIS, ROUTINE W REFLEX MICROSCOPIC
Bilirubin Urine: NEGATIVE
Glucose, UA: NEGATIVE mg/dL
Ketones, ur: NEGATIVE mg/dL
Leukocytes,Ua: NEGATIVE
Nitrite: NEGATIVE
Protein, ur: NEGATIVE mg/dL
Specific Gravity, Urine: 1.014 (ref 1.005–1.030)
pH: 7 (ref 5.0–8.0)

## 2021-05-08 LAB — COMPREHENSIVE METABOLIC PANEL
ALT: 23 U/L (ref 0–44)
AST: 29 U/L (ref 15–41)
Albumin: 3.8 g/dL (ref 3.5–5.0)
Alkaline Phosphatase: 107 U/L (ref 38–126)
Anion gap: 8 (ref 5–15)
BUN: 18 mg/dL (ref 8–23)
CO2: 26 mmol/L (ref 22–32)
Calcium: 8.8 mg/dL — ABNORMAL LOW (ref 8.9–10.3)
Chloride: 101 mmol/L (ref 98–111)
Creatinine, Ser: 0.79 mg/dL (ref 0.44–1.00)
GFR, Estimated: >60 mL/min (ref >60)
Glucose, Bld: 132 mg/dL — ABNORMAL HIGH (ref 70–99)
Potassium: 4.1 mmol/L (ref 3.5–5.1)
Sodium: 135 mmol/L (ref 135–145)
Total Bilirubin: 0.4 mg/dL (ref 0.3–1.2)
Total Protein: 7.6 g/dL (ref 6.5–8.1)

## 2021-05-08 LAB — CBC WITH DIFFERENTIAL/PLATELET
Abs Immature Granulocytes: 0.05 10*3/uL (ref 0.00–0.07)
Basophils Absolute: 0 10*3/uL (ref 0.0–0.1)
Basophils Relative: 0 %
Eosinophils Absolute: 0 10*3/uL (ref 0.0–0.5)
Eosinophils Relative: 0 %
HCT: 44.6 % (ref 36.0–46.0)
Hemoglobin: 15.1 g/dL — ABNORMAL HIGH (ref 12.0–15.0)
Immature Granulocytes: 0 %
Lymphocytes Relative: 8 %
Lymphs Abs: 1.1 10*3/uL (ref 0.7–4.0)
MCH: 31.9 pg (ref 26.0–34.0)
MCHC: 33.9 g/dL (ref 30.0–36.0)
MCV: 94.1 fL (ref 80.0–100.0)
Monocytes Absolute: 0.9 10*3/uL (ref 0.1–1.0)
Monocytes Relative: 6 %
Neutro Abs: 11.3 10*3/uL — ABNORMAL HIGH (ref 1.7–7.7)
Neutrophils Relative %: 86 %
Platelets: 212 10*3/uL (ref 150–400)
RBC: 4.74 MIL/uL (ref 3.87–5.11)
RDW: 12.1 % (ref 11.5–15.5)
WBC: 13.3 10*3/uL — ABNORMAL HIGH (ref 4.0–10.5)
nRBC: 0 % (ref 0.0–0.2)

## 2021-05-08 NOTE — ED Provider Notes (Signed)
[facility] EMERGENCY DEPARTMENT Provider Note   CSN: [id number] Arrival date & time: 05/08/21  1009     History Chief Complaint  Patient presents with   Altered Mental Status    Joy Bender is a 70 y.o. female with past medical history significant for hypertension, hyperlipidemia, paroxysmal A. fib not on current anticoagulation, as well as dementia.  Patient presents from her assisted living facility with new onset altered mental status.  Facility reports that she has been leaning her head to the left, and complaining of back pain.  Patient has been being treated for UTI since 25th of this month.  Attempted to call facility for further information, no response was reached.  Called son who reports the same as EMS, the patient was having some stiffness in her neck, complaining of back pain.  Patient does not repeat any of these complaints on my assessment today.  Son also reports that orientation minimally to self, general confusion overall is patient's baseline.  Facility report: Patient is normally ambulatory. Wasn't walking normally, neck was turned. Was given PRN clonopin, so is slightly more lethargic, but reports some lethargy prior to this.   Level 5 caveat: Dementia, altered mental status  Altered Mental Status     Past Medical History:  Diagnosis Date   Atypical chest pain    Status post admission in June 2012 ruled out for myocardial infarction status post stress echocardiogram without evidence for ischemia.   Dementia (HCC)    Hyperlipidemia    Lipid panel June 2012 cholesterol 214, triglycerides 144 HDL 40 LDL 145   Hypertension    Paroxysmal atrial fibrillation (HCC)    per Cardionet Monitor in 2009 only single episode of a brief run of atrial fibrillation noted on cardiac monitoring 2009 no clinical recurrence.    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 No pertinent surgical history.   OB History     Gravida  3   Para  3   Term      Preterm      AB      Living  3      SAB      IAB      Ectopic      Multiple      Live Births              Family History  Problem Relation Age of Onset   Cancer Brother    Cancer Son        liver    Social History   Tobacco Use   Smoking status: Never   Smokeless tobacco: Never  Vaping Use   Vaping Use: Never used  Substance Use Topics   Alcohol use: No    Alcohol/week: 0.0 standard drinks   Drug use: No    Home Medications Prior to Admission medications   Medication Sig Start Date End Date Taking? Authorizing Provider  Cholecalciferol (D3-1000) 25 MCG (1000 UT) tablet Take 1,000 Units by mouth daily.   Yes [provider]  ciprofloxacin (CIPRO) 250 MG tablet Take 250 mg by mouth 2 (two) times daily.   Yes [provider]  clonazePAM (KLONOPIN) 0.5 MG  tablet Take 0.5 mg by mouth 2 (two) times daily as needed for anxiety.   Yes [provider]  escitalopram (LEXAPRO) 5 MG tablet Take 5 mg by mouth daily.   Yes [provider]  loratadine (CLARITIN) 10 MG tablet Take 10 mg by mouth daily.   Yes [provider]  simvastatin (ZOCOR) 20 MG tablet Take 20 mg by mouth at bedtime.   Yes [provider]  vitamin B-12 (CYANOCOBALAMIN) 1000 MCG tablet Take 1,000 mcg by mouth daily.   Yes [provider]  sertraline (ZOLOFT) 50 MG tablet Take 1 tablet (50 mg total) by mouth daily. Patient not taking: No sig reported 08/01/19   Dettinger, Elige Radon, MD    Allergies    Patient has no known allergies.  Review of Systems   Review of Systems  Reason unable to perform ROS: Dementia, altered mental status.    Physical Exam Updated Vital Signs BP (!) 143/85   Pulse 92   Temp 98 F (36.7 C)   Resp 19   Ht 5\' 3"  (1.6 m)   Wt 64 kg   SpO2 95%   BMI 24.99 kg/m   Physical Exam Vitals and nursing note reviewed.  Constitutional:      Appearance: Normal appearance.     Comments: Confused, mumbling alert female in no acute distress, but not oriented to place or situation, minimally oriented to self on prompting  HENT:     Head: Normocephalic and atraumatic.  Eyes:     General:        Right eye: No discharge.        Left eye: No discharge.  Cardiovascular:     Rate and Rhythm: Normal rate and regular rhythm.     Heart sounds: No murmur heard.   No friction rub. No gallop.  Pulmonary:     Effort: Pulmonary effort is normal.     Breath sounds: Normal breath sounds.  Abdominal:     General: Bowel sounds are normal.     Palpations: Abdomen is soft.  Genitourinary:    Comments: No lesions or abnormality of vulva or urethra but patient does have some urine leaking during examination. Musculoskeletal:     Comments: Patient is able to move all 4 limbs spontaneously with some prompting, does not have any point tenderness to palpation on trauma exam.  No tenderness to palpation or reaction to palpation of midline spine cervical thoracic or lumbar.  No step-offs or deformity.  No evidence of recent fall.  Skin:    General: Skin is warm and dry.     Capillary Refill: Capillary refill takes less than 2 seconds.     Comments: Full trauma skin exam performed, patient does not have any signs of infected ulcers on her perineum, legs or elsewhere.  Neurological:     Mental Status: She is alert.     Comments: Patient is able to move limbs spontaneously with some prompting.  Patient has eyes that are equal round reactive to light.  Intact reflexes.  Unable to fully assess cranial nerves, patient does not have a facial droop, is able to articulate words well although word content is nonsensical. Alert  only to self on some prompting.  Psychiatric:        Mood and Affect: Mood normal.    ED Results / Procedures / Treatments   Labs (all labs ordered are listed, but only abnormal results are displayed) Labs Reviewed  CBC WITH DIFFERENTIAL/PLATELET - Abnormal; Notable  for the following components:      Result Value   WBC 13.3 (*)    Hemoglobin 15.1 (*)    Neutro Abs 11.3 (*)    All other components within normal limits  COMPREHENSIVE METABOLIC PANEL - Abnormal; Notable for the following components:   Glucose, Bld 132 (*)    Calcium 8.8 (*)    All other components within normal limits  URINALYSIS, ROUTINE W REFLEX MICROSCOPIC - Abnormal; Notable for the following components:   APPearance HAZY (*)    Hgb urine dipstick MODERATE (*)    Bacteria, UA RARE (*)    All other components within normal limits    EKG EKG Interpretation  Date/Time:  Friday May 08 2021 10:37:13 EDT Ventricular Rate:  96 PR Interval:    QRS Duration: 86 QT Interval:  402 QTC Calculation: 508 R Axis:   60 Text Interpretation: Atrial fibrillation vs. sinus - repeat orered Abnormal R-wave progression, early transition Nonspecific T abnormalities, lateral leads Prolonged QT interval Confirmed by Derwood Kaplan [phone]) on 05/08/2021 11:04:26 AM  Radiology DG Chest 2 View  Result Date: 05/08/2021 CLINICAL DATA:  Altered mental status EXAM: CHEST - 2 VIEW COMPARISON:  Chest x-ray 07/17/2014 FINDINGS: Heart size is normal. Mediastinum appears grossly stable. Tortuous thoracic aorta. Pulmonary vasculature is normal. No focal consolidation identified. No significant pleural effusion. No pneumothorax. Bones are osteopenic. IMPRESSION: No active cardiopulmonary disease. Electronically Signed   By: Jannifer Hick M.D.   On: 05/08/2021 11:13    Procedures Procedures   Medications Ordered in ED Medications - No data to display  ED Course  I have reviewed the triage vital signs and the nursing  notes.  Pertinent labs & imaging results that were available during my care of the patient were reviewed by me and considered in my medical decision making (see chart for details).    MDM Rules/Calculators/A&P                         I discussed this case with my attending physician who cosigned this note including patient's presenting symptoms, physical exam, and planned diagnostics and interventions. Attending physician stated agreement with plan or made changes to plan which were implemented.   Attending physician assessed patient at bedside.  Baseline dementia patient will who is alert and oriented only to self, was sent from facility with complaints of neck pain, back pain.  Patient is confused, alert oriented only to self on my exam.  Trauma exam does not reveal any evidence of fall, ulcerations, or other infection.  Notably patient is being treated for urinary tract infection since the 25th of this month, has been taking Keflex.  Initial EKG does show that patient is in atrial fibrillation, she has noted paroxysmal A. fib in the past. Questionable A. Fib vs. sinus on EKG given normal rhythm.  No evidence of fall, or altered mental status on my exam, patient has normal neuro exam for elements that I am able to assess given patient's advanced dementia.  Patient is alert and oriented to herself, and rambling in speech with no dysarthria.  This is her baseline after speaking with her son and her facility.  Patient's urine does show evidence of a currently being treated urinary tract infection, no evidence of pyelonephritis, sepsis, systemic infection.  Unable to assess patient's ambulation at this time because patient will not follow commands, however no evidence of back or neck injury on physical exam today.  Patient  discharged back to facility in stable condition.  Return precautions were given.  Spoke with son who agrees to plan.   Final Clinical Impression(s) / ED Diagnoses Final diagnoses:   Lethargy    Rx / DC Orders ED Discharge Orders     None        [place] 05/08/21 1542    Derwood Kaplan, MD 05/09/21 1023

## 2021-05-08 NOTE — ED Notes (Signed)
EDP states a pure wick will suffice to collect urine.

## 2021-05-08 NOTE — Discharge Instructions (Addendum)
Patient was evaluated thoroughly at our facility today. Her UA does seem to show that the antibiotics are working. Continue her keflex as prescribed. Patient denies neck pain, back pain today throughout her evaluation, has spontaneous movement of all of her limbs. She was stable compared to her baseline when discussing her dementia with her son and facility.  If you continue to have concerns about her mental status I recommend neuro check q4hours and re-evaluation if you notice focal changes.

## 2021-05-08 NOTE — ED Triage Notes (Signed)
Pt to the ED [facility] with altered mental status that began this morning. Pt has been treated for a UTI since the 25th of this month.  The facility states she is leaning her head to the left and she is complaining of back pain.  Pt has a history of dementia. Facility denies fevers.

## 2021-05-12 DIAGNOSIS — I10 Essential (primary) hypertension: Secondary | ICD-10-CM | POA: Diagnosis not present

## 2021-05-12 DIAGNOSIS — N390 Urinary tract infection, site not specified: Secondary | ICD-10-CM | POA: Diagnosis not present

## 2021-05-12 DIAGNOSIS — R296 Repeated falls: Secondary | ICD-10-CM | POA: Diagnosis not present

## 2021-05-12 DIAGNOSIS — E785 Hyperlipidemia, unspecified: Secondary | ICD-10-CM | POA: Diagnosis not present

## 2021-05-13 DIAGNOSIS — I10 Essential (primary) hypertension: Secondary | ICD-10-CM | POA: Diagnosis not present

## 2021-05-13 DIAGNOSIS — F0393 Unspecified dementia, unspecified severity, with mood disturbance: Secondary | ICD-10-CM | POA: Diagnosis not present

## 2021-05-13 DIAGNOSIS — F32A Depression, unspecified: Secondary | ICD-10-CM | POA: Diagnosis not present

## 2021-05-13 DIAGNOSIS — N390 Urinary tract infection, site not specified: Secondary | ICD-10-CM | POA: Diagnosis not present

## 2021-05-13 DIAGNOSIS — F0394 Unspecified dementia, unspecified severity, with anxiety: Secondary | ICD-10-CM | POA: Diagnosis not present

## 2021-05-14 DIAGNOSIS — F0393 Unspecified dementia, unspecified severity, with mood disturbance: Secondary | ICD-10-CM | POA: Diagnosis not present

## 2021-05-14 DIAGNOSIS — I10 Essential (primary) hypertension: Secondary | ICD-10-CM | POA: Diagnosis not present

## 2021-05-14 DIAGNOSIS — N390 Urinary tract infection, site not specified: Secondary | ICD-10-CM | POA: Diagnosis not present

## 2021-05-14 DIAGNOSIS — F0394 Unspecified dementia, unspecified severity, with anxiety: Secondary | ICD-10-CM | POA: Diagnosis not present

## 2021-05-14 DIAGNOSIS — F32A Depression, unspecified: Secondary | ICD-10-CM | POA: Diagnosis not present

## 2021-05-15 DIAGNOSIS — F32A Depression, unspecified: Secondary | ICD-10-CM | POA: Diagnosis not present

## 2021-05-15 DIAGNOSIS — F0394 Unspecified dementia, unspecified severity, with anxiety: Secondary | ICD-10-CM | POA: Diagnosis not present

## 2021-05-15 DIAGNOSIS — I10 Essential (primary) hypertension: Secondary | ICD-10-CM | POA: Diagnosis not present

## 2021-05-15 DIAGNOSIS — F0393 Unspecified dementia, unspecified severity, with mood disturbance: Secondary | ICD-10-CM | POA: Diagnosis not present

## 2021-05-15 DIAGNOSIS — N390 Urinary tract infection, site not specified: Secondary | ICD-10-CM | POA: Diagnosis not present

## 2021-05-18 DIAGNOSIS — L603 Nail dystrophy: Secondary | ICD-10-CM | POA: Diagnosis not present

## 2021-05-18 DIAGNOSIS — R600 Localized edema: Secondary | ICD-10-CM | POA: Diagnosis not present

## 2021-05-21 DIAGNOSIS — F0394 Unspecified dementia, unspecified severity, with anxiety: Secondary | ICD-10-CM | POA: Diagnosis not present

## 2021-05-21 DIAGNOSIS — I10 Essential (primary) hypertension: Secondary | ICD-10-CM | POA: Diagnosis not present

## 2021-05-21 DIAGNOSIS — F0393 Unspecified dementia, unspecified severity, with mood disturbance: Secondary | ICD-10-CM | POA: Diagnosis not present

## 2021-05-21 DIAGNOSIS — E038 Other specified hypothyroidism: Secondary | ICD-10-CM | POA: Diagnosis not present

## 2021-05-21 DIAGNOSIS — E559 Vitamin D deficiency, unspecified: Secondary | ICD-10-CM | POA: Diagnosis not present

## 2021-05-21 DIAGNOSIS — E119 Type 2 diabetes mellitus without complications: Secondary | ICD-10-CM | POA: Diagnosis not present

## 2021-05-21 DIAGNOSIS — F32A Depression, unspecified: Secondary | ICD-10-CM | POA: Diagnosis not present

## 2021-05-21 DIAGNOSIS — N390 Urinary tract infection, site not specified: Secondary | ICD-10-CM | POA: Diagnosis not present

## 2021-05-21 DIAGNOSIS — E785 Hyperlipidemia, unspecified: Secondary | ICD-10-CM | POA: Diagnosis not present

## 2021-05-21 DIAGNOSIS — D518 Other vitamin B12 deficiency anemias: Secondary | ICD-10-CM | POA: Diagnosis not present

## 2021-05-26 DIAGNOSIS — F0393 Unspecified dementia, unspecified severity, with mood disturbance: Secondary | ICD-10-CM | POA: Diagnosis not present

## 2021-05-26 DIAGNOSIS — N390 Urinary tract infection, site not specified: Secondary | ICD-10-CM | POA: Diagnosis not present

## 2021-05-26 DIAGNOSIS — I10 Essential (primary) hypertension: Secondary | ICD-10-CM | POA: Diagnosis not present

## 2021-05-26 DIAGNOSIS — F0394 Unspecified dementia, unspecified severity, with anxiety: Secondary | ICD-10-CM | POA: Diagnosis not present

## 2021-05-26 DIAGNOSIS — F32A Depression, unspecified: Secondary | ICD-10-CM | POA: Diagnosis not present

## 2021-05-31 ENCOUNTER — Emergency Department (HOSPITAL_COMMUNITY): Payer: Medicare Other

## 2021-05-31 ENCOUNTER — Emergency Department (HOSPITAL_COMMUNITY)
Admission: EM | Admit: 2021-05-31 | Discharge: 2021-06-01 | Disposition: A | Discharge status: Skilled Nursing Facility | Payer: Medicare Other | Attending: Emergency Medicine | Admitting: Emergency Medicine

## 2021-05-31 ENCOUNTER — Encounter (HOSPITAL_COMMUNITY): Payer: Self-pay | Admitting: *Deleted

## 2021-05-31 ENCOUNTER — Other Ambulatory Visit: Payer: Self-pay

## 2021-05-31 DIAGNOSIS — Z043 Encounter for examination and observation following other accident: Secondary | ICD-10-CM | POA: Diagnosis not present

## 2021-05-31 DIAGNOSIS — Z79899 Other long term (current) drug therapy: Secondary | ICD-10-CM | POA: Insufficient documentation

## 2021-05-31 DIAGNOSIS — F0390 Unspecified dementia without behavioral disturbance: Secondary | ICD-10-CM | POA: Insufficient documentation

## 2021-05-31 DIAGNOSIS — Y92129 Unspecified place in nursing home as the place of occurrence of the external cause: Secondary | ICD-10-CM | POA: Insufficient documentation

## 2021-05-31 DIAGNOSIS — W19XXXA Unspecified fall, initial encounter: Secondary | ICD-10-CM | POA: Insufficient documentation

## 2021-05-31 DIAGNOSIS — Z743 Need for continuous supervision: Secondary | ICD-10-CM | POA: Diagnosis not present

## 2021-05-31 DIAGNOSIS — I10 Essential (primary) hypertension: Secondary | ICD-10-CM | POA: Insufficient documentation

## 2021-05-31 DIAGNOSIS — S0003XA Contusion of scalp, initial encounter: Secondary | ICD-10-CM | POA: Diagnosis not present

## 2021-05-31 DIAGNOSIS — R5381 Other malaise: Secondary | ICD-10-CM | POA: Diagnosis not present

## 2021-05-31 DIAGNOSIS — R404 Transient alteration of awareness: Secondary | ICD-10-CM | POA: Diagnosis not present

## 2021-05-31 NOTE — Discharge Instructions (Signed)
Follow-up with your doctor if any problem 

## 2021-05-31 NOTE — ED Notes (Signed)
Called c-com at this time for transport of pt back to [place] . Joy Bender

## 2021-05-31 NOTE — ED Triage Notes (Signed)
Pt brought in by [facility] from [facility] in [place] with c/o fall today. EMS reports pt has skin tear to left elbow and hematoma to left side of head. No use of blood thinners. Pt has dementia and reports her cognitive status upon arrival is her normal.

## 2021-05-31 NOTE — ED Provider Notes (Signed)
[facility] EMERGENCY DEPARTMENT Provider Note   CSN: [id number] Arrival date & time: 05/31/21  1833     History Chief Complaint  Patient presents with   Joy Bender is a 70 y.o. female.  Patient fell the nursing home.  Patient has dementia and no complaint.  The history is provided by medical records and the EMS personnel.  Fall This is a new problem. The current episode started 6 to 12 hours ago. The problem occurs constantly. The problem has been resolved. Pertinent negatives include no chest pain, no abdominal pain and no headaches. Nothing aggravates the symptoms. Nothing relieves the symptoms. She has tried nothing for the symptoms.      Past Medical History:  Diagnosis Date   Atypical chest pain    Status post admission in June 2012 ruled out for myocardial infarction status post stress echocardiogram without evidence for ischemia.   Dementia (HCC)    Hyperlipidemia    Lipid panel June 2012 cholesterol 214, triglycerides 144 HDL 40 LDL 145   Hypertension    Paroxysmal atrial fibrillation (HCC)    per Cardionet Monitor in 2009 only single episode of a brief run of atrial fibrillation noted on cardiac monitoring 2009 no clinical recurrence.    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 No pertinent surgical history.   OB History     Gravida  3   Para  3   Term      Preterm      AB      Living  3      SAB      IAB      Ectopic      Multiple      Live Births              Family History  Problem Relation Age of Onset   Cancer Brother    Cancer Son        liver    Social History   Tobacco Use   Smoking status:  Never   Smokeless tobacco: Never  Vaping Use   Vaping Use: Never used  Substance Use Topics   Alcohol use: No    Alcohol/week: 0.0 standard drinks   Drug use: No    Home Medications Prior to Admission medications   Medication Sig Start Date End Date Taking? Authorizing Provider  Cholecalciferol (D3-1000) 25 MCG (1000 UT) tablet Take 1,000 Units by mouth daily.   Yes [provider]  clonazePAM (KLONOPIN) 0.5 MG tablet Take 0.5 mg by mouth 2 (two) times daily as needed for anxiety.   Yes [provider]  escitalopram (LEXAPRO) 5 MG tablet Take 5 mg by mouth daily.   Yes [provider]  magnesium hydroxide (MILK OF MAGNESIA) 400 MG/5ML suspension Take by mouth daily as needed for mild constipation.   Yes [provider]  simvastatin (ZOCOR) 20 MG tablet Take 20 mg by mouth at bedtime.   Yes [provider]  vitamin B-12 (CYANOCOBALAMIN) 1000 MCG tablet Take 1,000 mcg by mouth daily.   Yes [provider]  ciprofloxacin (CIPRO) 250 MG tablet Take 250 mg by mouth 2 (two)  times daily. Patient not taking: Reported on 05/31/2021    [provider]  loratadine (CLARITIN) 10 MG tablet Take 10 mg by mouth daily. Patient not taking: Reported on 05/31/2021    [provider]  potassium chloride (KLOR-CON) 10 MEQ tablet  02/03/21   [provider]  sertraline (ZOLOFT) 50 MG tablet Take 1 tablet (50 mg total) by mouth daily. Patient not taking: Reported on 05/08/2021 08/01/19   Dettinger, Fransisca Kaufmann, MD    Allergies    Patient has no known allergies.  Review of Systems   Review of Systems  Constitutional:  Negative for appetite change and fatigue.  HENT:  Negative for congestion, ear discharge and sinus pressure.   Eyes:  Negative for discharge.  Respiratory:  Negative for cough.   Cardiovascular:  Negative for chest pain.  Gastrointestinal:  Negative for abdominal pain and diarrhea.  Genitourinary:  Negative for  frequency and hematuria.  Musculoskeletal:  Negative for back pain.  Skin:  Negative for rash.  Neurological:  Negative for seizures and headaches.  Psychiatric/Behavioral:  Negative for hallucinations.    Physical Exam Updated Vital Signs BP (!) 140/49 (BP Location: Left Arm)   Pulse 82   Temp 98.7 F (37.1 C) (Oral)   Resp 18   Ht 5\' 3"  (1.6 m)   Wt 64 kg   BMI 24.99 kg/m   Physical Exam Vitals and nursing note reviewed.  Constitutional:      Appearance: She is well-developed.  HENT:     Head: Normocephalic.     Nose: Nose normal.  Eyes:     General: No scleral icterus.    Conjunctiva/sclera: Conjunctivae normal.  Neck:     Thyroid: No thyromegaly.  Cardiovascular:     Rate and Rhythm: Normal rate and regular rhythm.     Heart sounds: No murmur heard.   No friction rub. No gallop.  Pulmonary:     Breath sounds: No stridor. No wheezing or rales.  Chest:     Chest wall: No tenderness.  Abdominal:     General: There is no distension.     Tenderness: There is no abdominal tenderness. There is no rebound.  Musculoskeletal:        General: Normal range of motion.     Cervical back: Neck supple.  Lymphadenopathy:     Cervical: No cervical adenopathy.  Skin:    Findings: No erythema or rash.  Neurological:     Mental Status: She is alert.     Motor: No abnormal muscle tone.     Coordination: Coordination normal.     Comments: Patient oriented to person only  Psychiatric:        Behavior: Behavior normal.    ED Results / Procedures / Treatments   Labs (all labs ordered are listed, but only abnormal results are displayed) Labs Reviewed - No data to display  EKG None  Radiology CT Head Wo Contrast  Result Date: 05/31/2021 CLINICAL DATA:  [place], left-sided scalp hematoma, dimension EXAM: CT HEAD WITHOUT CONTRAST TECHNIQUE: Contiguous axial images were obtained from the base of the skull through the vertex without intravenous contrast. COMPARISON:  01/01/2021  FINDINGS: Brain: No acute infarct or hemorrhage. Scattered hypodensities in the periventricular white matter are stable, consistent with chronic small vessel ischemic change. Lateral ventricles and midline structures are otherwise unremarkable. No acute extra-axial fluid collections. No mass effect. Vascular: No hyperdense vessel or unexpected calcification. Skull: Minimal scalp edema along the left parietal convexity. No acute  calvarial fracture. Sinuses/Orbits: No acute finding. Other: None. IMPRESSION: 1. Stable head CT, no acute intracranial process. Electronically Signed   By: Randa Ngo M.D.   On: 05/31/2021 20:05    Procedures Procedures   Medications Ordered in ED Medications - No data to display  ED Course  I have reviewed the triage vital signs and the nursing notes.  Pertinent labs & imaging results that were available during my care of the patient were reviewed by me and considered in my medical decision making (see chart for details).    MDM Rules/Calculators/A&P                           Patient with a fall.  CT scan negative.  She will follow-up with PCP as needed Final Clinical Impression(s) / ED Diagnoses Final diagnoses:  Fall, initial encounter    Rx / DC Orders ED Discharge Orders     None        Milton Ferguson, MD 06/01/21 1043

## 2021-06-01 NOTE — ED Notes (Signed)
Attempted to call report to facility x 14 min with no answer.

## 2021-06-09 DIAGNOSIS — I10 Essential (primary) hypertension: Secondary | ICD-10-CM | POA: Diagnosis not present

## 2021-06-14 ENCOUNTER — Other Ambulatory Visit: Payer: Self-pay

## 2021-06-14 ENCOUNTER — Emergency Department (HOSPITAL_COMMUNITY): Payer: Medicare Other

## 2021-06-14 ENCOUNTER — Emergency Department (HOSPITAL_COMMUNITY)
Admission: EM | Admit: 2021-06-14 | Discharge: 2021-06-14 | Disposition: A | Discharge status: Skilled Nursing Facility | Payer: Medicare Other | Source: Home / Self Care | Attending: Emergency Medicine | Admitting: Emergency Medicine

## 2021-06-14 ENCOUNTER — Inpatient Hospital Stay (HOSPITAL_COMMUNITY)
Admission: EM | Admit: 2021-06-14 | Discharge: 2021-06-16 | DRG: 100 | Disposition: A | Discharge status: Nursing Home (Includes ICF) | Payer: Medicare Other | Source: Skilled Nursing Facility | Attending: Internal Medicine | Admitting: Internal Medicine

## 2021-06-14 ENCOUNTER — Encounter (HOSPITAL_COMMUNITY): Payer: Self-pay | Admitting: *Deleted

## 2021-06-14 ENCOUNTER — Encounter (HOSPITAL_COMMUNITY): Payer: Self-pay | Admitting: Internal Medicine

## 2021-06-14 DIAGNOSIS — F0390 Unspecified dementia without behavioral disturbance: Secondary | ICD-10-CM | POA: Insufficient documentation

## 2021-06-14 DIAGNOSIS — E7220 Disorder of urea cycle metabolism, unspecified: Secondary | ICD-10-CM | POA: Diagnosis not present

## 2021-06-14 DIAGNOSIS — S0083XA Contusion of other part of head, initial encounter: Secondary | ICD-10-CM | POA: Diagnosis not present

## 2021-06-14 DIAGNOSIS — I10 Essential (primary) hypertension: Secondary | ICD-10-CM | POA: Insufficient documentation

## 2021-06-14 DIAGNOSIS — R296 Repeated falls: Secondary | ICD-10-CM | POA: Diagnosis not present

## 2021-06-14 DIAGNOSIS — G40909 Epilepsy, unspecified, not intractable, without status epilepticus: Principal | ICD-10-CM | POA: Diagnosis present

## 2021-06-14 DIAGNOSIS — S50312A Abrasion of left elbow, initial encounter: Secondary | ICD-10-CM

## 2021-06-14 DIAGNOSIS — R9082 White matter disease, unspecified: Secondary | ICD-10-CM | POA: Diagnosis not present

## 2021-06-14 DIAGNOSIS — S51012A Laceration without foreign body of left elbow, initial encounter: Secondary | ICD-10-CM | POA: Diagnosis not present

## 2021-06-14 DIAGNOSIS — W07XXXA Fall from chair, initial encounter: Secondary | ICD-10-CM | POA: Insufficient documentation

## 2021-06-14 DIAGNOSIS — Z743 Need for continuous supervision: Secondary | ICD-10-CM | POA: Diagnosis not present

## 2021-06-14 DIAGNOSIS — R569 Unspecified convulsions: Secondary | ICD-10-CM

## 2021-06-14 DIAGNOSIS — F03C4 Unspecified dementia, severe, with anxiety: Secondary | ICD-10-CM | POA: Diagnosis not present

## 2021-06-14 DIAGNOSIS — E785 Hyperlipidemia, unspecified: Secondary | ICD-10-CM | POA: Diagnosis not present

## 2021-06-14 DIAGNOSIS — Z79899 Other long term (current) drug therapy: Secondary | ICD-10-CM | POA: Insufficient documentation

## 2021-06-14 DIAGNOSIS — Z66 Do not resuscitate: Secondary | ICD-10-CM | POA: Diagnosis present

## 2021-06-14 DIAGNOSIS — S0990XA Unspecified injury of head, initial encounter: Secondary | ICD-10-CM | POA: Diagnosis not present

## 2021-06-14 DIAGNOSIS — G9341 Metabolic encephalopathy: Secondary | ICD-10-CM | POA: Diagnosis not present

## 2021-06-14 DIAGNOSIS — Z043 Encounter for examination and observation following other accident: Secondary | ICD-10-CM | POA: Diagnosis not present

## 2021-06-14 DIAGNOSIS — R000 Tachycardia, unspecified: Secondary | ICD-10-CM | POA: Diagnosis not present

## 2021-06-14 DIAGNOSIS — F411 Generalized anxiety disorder: Secondary | ICD-10-CM | POA: Diagnosis present

## 2021-06-14 DIAGNOSIS — Z20822 Contact with and (suspected) exposure to covid-19: Secondary | ICD-10-CM | POA: Diagnosis present

## 2021-06-14 DIAGNOSIS — I480 Paroxysmal atrial fibrillation: Secondary | ICD-10-CM | POA: Diagnosis present

## 2021-06-14 DIAGNOSIS — D72823 Leukemoid reaction: Secondary | ICD-10-CM | POA: Diagnosis present

## 2021-06-14 DIAGNOSIS — R5381 Other malaise: Secondary | ICD-10-CM | POA: Diagnosis not present

## 2021-06-14 DIAGNOSIS — F03C0 Unspecified dementia, severe, without behavioral disturbance, psychotic disturbance, mood disturbance, and anxiety: Secondary | ICD-10-CM | POA: Diagnosis not present

## 2021-06-14 DIAGNOSIS — W19XXXA Unspecified fall, initial encounter: Secondary | ICD-10-CM

## 2021-06-14 DIAGNOSIS — S199XXA Unspecified injury of neck, initial encounter: Secondary | ICD-10-CM | POA: Diagnosis not present

## 2021-06-14 DIAGNOSIS — R2681 Unsteadiness on feet: Secondary | ICD-10-CM | POA: Diagnosis not present

## 2021-06-14 LAB — CBC WITH DIFFERENTIAL/PLATELET
Basophils Absolute: 0 10*3/uL (ref 0.0–0.1)
Basophils Relative: 0 %
Eosinophils Absolute: 0 10*3/uL (ref 0.0–0.5)
Eosinophils Relative: 0 %
HCT: 42.1 % (ref 36.0–46.0)
Hemoglobin: 13.7 g/dL (ref 12.0–15.0)
Lymphocytes Relative: 30 %
Lymphs Abs: 5.5 10*3/uL — ABNORMAL HIGH (ref 0.7–4.0)
MCH: 31.9 pg (ref 26.0–34.0)
MCHC: 32.5 g/dL (ref 30.0–36.0)
MCV: 97.9 fL (ref 80.0–100.0)
Monocytes Absolute: 1.1 10*3/uL — ABNORMAL HIGH (ref 0.1–1.0)
Monocytes Relative: 6 %
Neutro Abs: 11.6 10*3/uL — ABNORMAL HIGH (ref 1.7–7.7)
Neutrophils Relative %: 64 %
Platelets: 337 10*3/uL (ref 150–400)
RBC: 4.3 MIL/uL (ref 3.87–5.11)
RDW: 13.4 % (ref 11.5–15.5)
WBC: 18.2 10*3/uL — ABNORMAL HIGH (ref 4.0–10.5)
nRBC: 0 % (ref 0.0–0.2)

## 2021-06-14 LAB — RESP PANEL BY RT-PCR (FLU A&B, COVID) ARPGX2
Influenza A by PCR: NEGATIVE
Influenza B by PCR: NEGATIVE
SARS Coronavirus 2 by RT PCR: NEGATIVE

## 2021-06-14 LAB — COMPREHENSIVE METABOLIC PANEL
ALT: 24 U/L (ref 0–44)
AST: 34 U/L (ref 15–41)
Albumin: 3.3 g/dL — ABNORMAL LOW (ref 3.5–5.0)
Alkaline Phosphatase: 97 U/L (ref 38–126)
Anion gap: 11 (ref 5–15)
BUN: 16 mg/dL (ref 8–23)
CO2: 23 mmol/L (ref 22–32)
Calcium: 8.6 mg/dL — ABNORMAL LOW (ref 8.9–10.3)
Chloride: 104 mmol/L (ref 98–111)
Creatinine, Ser: 0.65 mg/dL (ref 0.44–1.00)
GFR, Estimated: >60 mL/min (ref >60)
Glucose, Bld: 106 mg/dL — ABNORMAL HIGH (ref 70–99)
Potassium: 4.4 mmol/L (ref 3.5–5.1)
Sodium: 138 mmol/L (ref 135–145)
Total Bilirubin: 0.6 mg/dL (ref 0.3–1.2)
Total Protein: 6.5 g/dL (ref 6.5–8.1)

## 2021-06-14 LAB — MRSA NEXT GEN BY PCR, NASAL: MRSA by PCR Next Gen: NOT DETECTED

## 2021-06-14 LAB — MAGNESIUM: Magnesium: 2.1 mg/dL (ref 1.7–2.4)

## 2021-06-14 LAB — CBG MONITORING, ED: Glucose-Capillary: 108 mg/dL — ABNORMAL HIGH (ref 70–99)

## 2021-06-14 MED ORDER — ENOXAPARIN SODIUM 40 MG/0.4ML IJ SOSY
40.0000 mg | PREFILLED_SYRINGE | INTRAMUSCULAR | Status: DC
  Administered 2021-06-14 – 2021-06-15 (×2): 40 mg via SUBCUTANEOUS
  Filled 2021-06-14 (×2): qty 0.4

## 2021-06-14 MED ORDER — SODIUM CHLORIDE 0.9 % IV SOLN: INTRAVENOUS | Status: DC

## 2021-06-14 MED ORDER — DIVALPROEX SODIUM 125 MG PO CSDR
500.0000 mg | DELAYED_RELEASE_CAPSULE | Freq: Two times a day (BID) | ORAL | Status: DC
  Administered 2021-06-15 – 2021-06-16 (×3): 500 mg via ORAL
  Filled 2021-06-14 (×4): qty 4

## 2021-06-14 MED ORDER — SODIUM CHLORIDE 0.9 % IV SOLN
75.0000 mL/h | INTRAVENOUS | Status: DC
  Administered 2021-06-14 – 2021-06-15 (×2): 75 mL/h via INTRAVENOUS

## 2021-06-14 MED ORDER — LORAZEPAM 2 MG/ML IJ SOLN
1.0000 mg | Freq: Once | INTRAMUSCULAR | Status: AC
  Administered 2021-06-14: 13:00:00 1 mg via INTRAVENOUS
  Filled 2021-06-14: qty 1

## 2021-06-14 MED ORDER — DIVALPROEX SODIUM 125 MG PO CSDR
1000.0000 mg | DELAYED_RELEASE_CAPSULE | Freq: Once | ORAL | Status: AC
  Administered 2021-06-14: 18:00:00 1000 mg via ORAL
  Filled 2021-06-14: qty 8

## 2021-06-14 MED ORDER — MAGNESIUM HYDROXIDE 400 MG/5ML PO SUSP: 5.0000 mL | Freq: Every day | ORAL | Status: DC | PRN

## 2021-06-14 MED ORDER — HALOPERIDOL LACTATE 5 MG/ML IJ SOLN
1.0000 mg | Freq: Four times a day (QID) | INTRAMUSCULAR | Status: DC | PRN
  Administered 2021-06-14: 18:00:00 1 mg via INTRAVENOUS
  Filled 2021-06-14: qty 1

## 2021-06-14 MED ORDER — CITALOPRAM HYDROBROMIDE 20 MG PO TABS
20.0000 mg | ORAL_TABLET | Freq: Every day | ORAL | Status: DC
  Administered 2021-06-14 – 2021-06-16 (×3): 20 mg via ORAL
  Filled 2021-06-14 (×4): qty 1

## 2021-06-14 MED ORDER — VITAMIN B-12 1000 MCG PO TABS
1000.0000 ug | ORAL_TABLET | Freq: Every day | ORAL | Status: DC
  Administered 2021-06-14 – 2021-06-16 (×3): 1000 ug via ORAL
  Filled 2021-06-14 (×4): qty 1

## 2021-06-14 MED ORDER — LORAZEPAM 2 MG/ML IJ SOLN: 4.0000 mg | INTRAMUSCULAR | Status: DC | PRN

## 2021-06-14 MED ORDER — SIMVASTATIN 20 MG PO TABS
20.0000 mg | ORAL_TABLET | Freq: Every day | ORAL | Status: DC
  Administered 2021-06-14 – 2021-06-15 (×2): 20 mg via ORAL
  Filled 2021-06-14 (×2): qty 1

## 2021-06-14 MED ORDER — SENNA 8.6 MG PO TABS
1.0000 | ORAL_TABLET | Freq: Two times a day (BID) | ORAL | Status: DC
  Administered 2021-06-14 – 2021-06-16 (×4): 8.6 mg via ORAL
  Filled 2021-06-14 (×4): qty 1

## 2021-06-14 MED ORDER — CLONAZEPAM 0.5 MG PO TABS
0.5000 mg | ORAL_TABLET | Freq: Two times a day (BID) | ORAL | Status: DC | PRN
  Administered 2021-06-14: 19:00:00 0.5 mg via ORAL
  Filled 2021-06-14: qty 1

## 2021-06-14 NOTE — Consult Note (Signed)
[facility] Telemedicine Consult   Requesting Provider: Darlyn Chamber Consult Participants: Patient, son, bedisde nurse Location of the provider: [facility] Location of the patient: [facility]   This consult was provided via telemedicine with 2-way video and audio communication. The patient/family was informed that care would be provided in this way and agreed to receive care in this manner.    Chief Complaint: Seizures  HPI: Joy Bender is a 70 year old female with advanced dementia who presents with two episodes concerning for seizure today.  The first episode she was sitting in a chair when she suddenly had some seizure-like activity and fell out of the chair.  She was initially brought in as a fall, and it was not initially clear that she had had seizure-like activity, she was evaluated in the emergency department and as she was being discharged and put back into her car her son witnessed another episode of seizure-like activity.  She went completely stiff and locked up for about 30 seconds.  Her eyes were open and staring fixed straight ahead.  Following the stiffness, she was brought back into the emergency department and has since returned close to baseline.  At baseline, she is total care, unable to do any of her own activities of daily living.  She often will repeat the same phrase over and over again.  In August 2021, she had an MMSE documented of 10.  Family notes that she has been seeming to go downhill over over the last few months.  As far as medication changes, the only recent change has been an increase in her Lexapro a few days ago.  ROS: unable to be performed due to AMS  Past Medical History:  Diagnosis Date   Atypical chest pain    Status post admission in June 2012 ruled out for myocardial infarction status post stress echocardiogram without evidence for ischemia.   Dementia (HCC)    Hyperlipidemia    Lipid panel June 2012  cholesterol 214, triglycerides 144 HDL 40 LDL 145   Hypertension    Paroxysmal atrial fibrillation (HCC)    per Cardionet Monitor in 2009 only single episode of a brief run of atrial fibrillation noted on cardiac monitoring 2009 no clinical recurrence.    PVC's (premature ventricular contractions)     FHx:  Mother - alzhemiers disease  SHx: lives in nursing home.   Exam: Vitals:   06/14/21 1330 06/14/21 1400  BP: 135/82 (!) 149/128  Pulse: (!) 112 (!) 107  Resp: (!) 21 (!) 29  SpO2: 100% 99%    General: She is in bed in mittens.   MS:  -She is not oriented - She does not attend to the conversation - She repeats "ok" or mumbled nonsense, she does not follow commands.  - She does not appear aware of her current situation.   CNII: She clearly blinks to threat from the right, less clear from the left.  CNIII/IV/VI: eyes are midline, but do cross midline at least to some degree in both directions. Pupils are reactive bilaterally.  CN V: blinks to eyelid stimulation bilaterally CNVII: face appears grossly symmetric CNXII: tongue is midline.   Motor: She appears to have good strength bilaterally. She moves both arms and both legs with symmetric strength, no obvious increased tone by video.   Sensation: she responds to minor stimulation bilaterally.    Imaging Reviewed:   Labs reviewed in epic and pertinent values follow: Na 138 Ca corrects to 9.2 CBG 108  WBC 18  Cr 0.65 LFTs are normal    Assessment: 70 year old female who presents with new onset seizures in the setting of advanced dementia.  She does have an elevated WBC, but this can be seen postictally.  She is not febrile, and has not been displaying signs of infection.  My suspicion is that this simply represents a complication of her underlying dementia, but she should be screened for possible instigating culprits such as hypomagnesemia, UTI, etc.  Especially given that she has had multiple events, I would favor  starting antiepileptic therapy at this time.  Recommendations:  1) start Depakote 1 g orally, then 500 mg twice daily, I will use sprinkles 2) she will need an EEG 3) though I would typically recommend an MRI, I doubt this will change management at this time and do not think she would cooperate 4) basic infectious screen per internal medicine/ED  5) magnesium  6) neurology will follow    Ritta Slot, MD [facility] [phone]  If 7pm- 7am, please page neurology on call as listed in AMION.

## 2021-06-14 NOTE — Discharge Instructions (Signed)
CT head and neck without any acute findings.  X-rays of both hips and pelvis without any bony abnormalities no fractures.  An x-ray of the left elbow without any bony abnormalities.  Patient stable to go back to nursing facility.  Wound care for the skin tear abrasion to the left elbow per nursing facility.

## 2021-06-14 NOTE — ED Triage Notes (Signed)
Pt brought in by [facility] from [facility] in [place] with c/o fall today out of a chair. EMS reports pt has skin tear to left elbow and hematoma behind left ear. No use of blood thinners.

## 2021-06-14 NOTE — H&P (Signed)
History and Physical    Joy Bender MVE:[id number] DOB: May 17, 1951 DOA: 06/14/2021  PCP: Galvin Proffer, MD (Confirm with patient/family/NH records and if not entered, this has to be entered at [facility] point of entry) Patient coming from: SNF  I have personally briefly reviewed patient's old medical records in [facility]  Chief Complaint: fall, new onset seizure  HPI: Joy Bender is a 70 y.o. female with medical history significant of advanced dementia requiring 100% assist with ADLs, HLD, HTN, h/o PAF was brought to ED after falling out of her chiar at [facility]. Full ED eval negative for injury or metabolic derangement or infection. After being d/c's from ED she had a witnessed seizure when getting into her son's truck and was brought back to ED for further evaluation.   ED Course: 98.2  154/79  HR 91  RR 20  BMI 25. Per Neuro exam - non-focal exam. Cmet with Albumin 3.3, Mag nl, U/A negative, WBC 18.2 with nl diff. CT head, neck negative; CXR negative; hip/pelvis negative. Per neuro recommendations patient loaded with Depakote. Tele admit recommended. [facility] called to admit patient.   Review of Systems: As per HPI otherwise 10 point review of systems negative.    Past Medical History:  Diagnosis Date   Atypical chest pain    Status post admission in June 2012 ruled out for myocardial infarction status post stress echocardiogram without evidence for ischemia.   Dementia (HCC)    Hyperlipidemia    Lipid panel June 2012 cholesterol 214, triglycerides 144 HDL 40 LDL 145   Hypertension    Paroxysmal atrial fibrillation (HCC)    per Cardionet Monitor in 2009 only single episode of a brief run of atrial fibrillation noted on cardiac monitoring 2009 no clinical recurrence.    PVC's (premature ventricular contractions)     No past surgical history on file.   Soc Hx -  1st marriage ended in divorce. Had one son -present today. 2nd marriage - 8yrs with death of spouse fall 13-Nov-2022. One son,  one Daughter from this union. She worked at [employer] for 20 years.   reports that she has never smoked. She has never used smokeless tobacco. She reports that she does not drink alcohol and does not use drugs.  No Known Allergies  Family History  Problem Relation Age of Onset   Cancer Brother    Cancer Son        liver    Prior to Admission medications   Medication Sig Start Date End Date Taking? Authorizing Provider  Cholecalciferol (D3-1000) 25 MCG (1000 UT) tablet Take 1,000 Units by mouth daily.    [provider]  ciprofloxacin (CIPRO) 250 MG tablet Take 250 mg by mouth 2 (two) times daily. Patient not taking: Reported on 05/31/2021    [provider]  clonazePAM (KLONOPIN) 0.5 MG tablet Take 0.5 mg by mouth 2 (two) times daily as needed for anxiety.    [provider]  escitalopram (LEXAPRO) 5 MG tablet Take 5 mg by mouth daily.    [provider]  loratadine (CLARITIN) 10 MG tablet Take 10 mg by mouth daily. Patient not taking: Reported on 05/31/2021    [provider]  magnesium hydroxide (MILK OF MAGNESIA) 400 MG/5ML suspension Take by mouth daily as needed for mild constipation.    [provider]  potassium chloride (KLOR-CON) 10 MEQ tablet  02/03/21   [provider]  sertraline (ZOLOFT) 50 MG tablet Take  1 tablet (50 mg total) by mouth daily. Patient not taking: Reported on 05/08/2021 08/01/19   Dettinger, Elige Radon, MD  simvastatin (ZOCOR) 20 MG tablet Take 20 mg by mouth at bedtime.    [provider]  vitamin B-12 (CYANOCOBALAMIN) 1000 MCG tablet Take 1,000 mcg by mouth daily.    [provider]    Physical Exam: Vitals:   06/14/21 1430 06/14/21 1500 06/14/21 1520 06/14/21 1530  BP: (!) 144/78 (!) 162/145 (!) 131/92 (!) 154/79  Pulse: (!) 101 100 99 91  Resp: (!) 21 19 16 20   SpO2: 95% 100% 97% 98%    Vitals:   06/14/21 1430 06/14/21 1500 06/14/21 1520 06/14/21  1530  BP: (!) 144/78 (!) 162/145 (!) 131/92 (!) 154/79  Pulse: (!) 101 100 99 91  Resp: (!) 21 19 16 20   SpO2: 95% 100% 97% 98%   General: Elderly woman with temopral wasting, very agitated/delerious. Eyes: PERRL, lids and conjunctivae normal ENMT: Mucous membranes are dry. Posterior pharynx clear of any exudate or lesions.Normal dentition.  Neck: normal, supple, no masses, no thyromegaly Respiratory: clear to auscultation bilaterally, no wheezing, no crackles. Normal respiratory effort. No accessory muscle use.  Cardiovascular: Regular rate and rhythm w/ premature beats,  no murmurs / rubs / gallops. No extremity edema. 2+ pedal pulses. No carotid bruits.  Abdomen: no tenderness, no masses palpated. No hepatosplenomegaly. Bowel sounds positive.  Musculoskeletal: no clubbing / cyanosis. No joint deformity upper and lower extremities. Good ROM, no contractures. Normal muscle tone. Interosseous wasting both hands Skin:  rash-macular pin-point rash across upper back, lesions, ulcers. No induration Neurologic: CN 2-12 grossly intact.  Strength 5/5 in all 4.  Psychiatric: Awake, very agitated, reaching out to things, non-sensical speech.     Labs on Admission: I have personally reviewed following labs and imaging studies  CBC: Recent Labs  Lab 06/14/21 1306  WBC 18.2*  NEUTROABS 11.6*  HGB 13.7  HCT 42.1  MCV 97.9  PLT 337   Basic Metabolic Panel: Recent Labs  Lab 06/14/21 1354  NA 138  K 4.4  CL 104  CO2 23  GLUCOSE 106*  BUN 16  CREATININE 0.65  CALCIUM 8.6*  MG 2.1   GFR: Estimated Creatinine Clearance: 57.2 mL/min (by C-G formula based on SCr of 0.65 mg/dL). Liver Function Tests: Recent Labs  Lab 06/14/21 1354  AST 34  ALT 24  ALKPHOS 97  BILITOT 0.6  PROT 6.5  ALBUMIN 3.3*   No results for input(s): LIPASE, AMYLASE in the last 168 hours. No results for input(s): AMMONIA in the last 168 hours. Coagulation Profile: No results for input(s): INR, PROTIME in  the last 168 hours. Cardiac Enzymes: No results for input(s): CKTOTAL, CKMB, CKMBINDEX, TROPONINI in the last 168 hours. BNP (last 3 results) No results for input(s): PROBNP in the last 8760 hours. HbA1C: No results for input(s): HGBA1C in the last 72 hours. CBG: Recent Labs  Lab 06/14/21 1258  GLUCAP 108*   Lipid Profile: No results for input(s): CHOL, HDL, LDLCALC, TRIG, CHOLHDL, LDLDIRECT in the last 72 hours. Thyroid Function Tests: No results for input(s): TSH, T4TOTAL, FREET4, T3FREE, THYROIDAB in the last 72 hours. Anemia Panel: No results for input(s): VITAMINB12, FOLATE, FERRITIN, TIBC, IRON, RETICCTPCT in the last 72 hours. Urine analysis:    Component Value Date/Time   COLORURINE YELLOW 05/08/2021 1240   APPEARANCEUR HAZY (A) 05/08/2021 1240   LABSPEC 1.014 05/08/2021 1240   PHURINE 7.0 05/08/2021 1240   GLUCOSEU NEGATIVE  05/08/2021 1240   HGBUR MODERATE (A) 05/08/2021 1240   BILIRUBINUR NEGATIVE 05/08/2021 1240   KETONESUR NEGATIVE 05/08/2021 1240   PROTEINUR NEGATIVE 05/08/2021 1240   NITRITE NEGATIVE 05/08/2021 1240   LEUKOCYTESUR NEGATIVE 05/08/2021 1240    Radiological Exams on Admission: DG Elbow Complete Left  Result Date: 06/14/2021 CLINICAL DATA:  Fall, altered mental status, dementia. EXAM: LEFT ELBOW - COMPLETE 3+ VIEW COMPARISON:  None. FINDINGS: Osseous alignment is normal. No fracture line or displaced fracture fragment is seen. No degenerative change. No evidence of joint effusion. Surrounding soft tissues are unremarkable. IMPRESSION: Negative. Electronically Signed   By: Bary Richard M.D.   On: 06/14/2021 10:24   CT Head Wo Contrast  Result Date: 06/14/2021 CLINICAL DATA:  Seizure EXAM: CT HEAD WITHOUT CONTRAST TECHNIQUE: Contiguous axial images were obtained from the base of the skull through the vertex without intravenous contrast. COMPARISON:  06/14/2021 at 10:57 a.m. FINDINGS: Brain: As on the prior exam, the patient was not able to assist  with positioning and as result, the axial images are somewhat distorted by head tilt and head turning. Motion artifact noted. Periventricular white matter and corona radiata hypodensities favor chronic ischemic microvascular white matter disease. Otherwise, the brainstem, cerebellum, cerebral peduncles, thalamus, basal ganglia, basilar cisterns, and ventricular system appear within normal limits. No intracranial hemorrhage, mass lesion, or acute CVA. Vascular: There is atherosclerotic calcification of the cavernous carotid arteries bilaterally. Skull: Unremarkable Sinuses/Orbits: Unremarkable Other: No supplemental non-categorized findings. IMPRESSION: 1. No acute intracranial findings. 2. Periventricular white matter and corona radiata hypodensities favor chronic ischemic microvascular white matter disease. Electronically Signed   By: Gaylyn Rong M.D.   On: 06/14/2021 13:39   CT Head Wo Contrast  Result Date: 06/14/2021 CLINICAL DATA:  Head trauma, fall EXAM: CT HEAD WITHOUT CONTRAST CT CERVICAL SPINE WITHOUT CONTRAST TECHNIQUE: Multidetector CT imaging of the head and cervical spine was performed following the standard protocol without intravenous contrast. Multiplanar CT image reconstructions of the cervical spine were also generated. COMPARISON:  05/31/2021 FINDINGS: CT HEAD FINDINGS Brain: No evidence of acute infarction, hemorrhage, hydrocephalus, extra-axial collection or mass lesion/mass effect. Periventricular and deep white matter hypodensity. Vascular: No hyperdense vessel or unexpected calcification. Skull: Normal. Negative for fracture or focal lesion. Sinuses/Orbits: No acute finding. Other: None. CT CERVICAL SPINE FINDINGS Alignment: Straightening of the normal cervical lordosis, likely positional. Skull base and vertebrae: No acute fracture. No primary bone lesion or focal pathologic process. Soft tissues and spinal canal: No prevertebral fluid or swelling. No visible canal hematoma. Disc  levels:  Intact. Upper chest: Negative. Other: None. IMPRESSION: 1. No acute intracranial pathology. Small-vessel white matter disease. 2. No fracture or static subluxation of the cervical spine. Electronically Signed   By: Jearld Lesch M.D.   On: 06/14/2021 11:41   CT Cervical Spine Wo Contrast  Result Date: 06/14/2021 CLINICAL DATA:  Head trauma, fall EXAM: CT HEAD WITHOUT CONTRAST CT CERVICAL SPINE WITHOUT CONTRAST TECHNIQUE: Multidetector CT imaging of the head and cervical spine was performed following the standard protocol without intravenous contrast. Multiplanar CT image reconstructions of the cervical spine were also generated. COMPARISON:  05/31/2021 FINDINGS: CT HEAD FINDINGS Brain: No evidence of acute infarction, hemorrhage, hydrocephalus, extra-axial collection or mass lesion/mass effect. Periventricular and deep white matter hypodensity. Vascular: No hyperdense vessel or unexpected calcification. Skull: Normal. Negative for fracture or focal lesion. Sinuses/Orbits: No acute finding. Other: None. CT CERVICAL SPINE FINDINGS Alignment: Straightening of the normal cervical lordosis, likely positional. Skull base and vertebrae:  No acute fracture. No primary bone lesion or focal pathologic process. Soft tissues and spinal canal: No prevertebral fluid or swelling. No visible canal hematoma. Disc levels:  Intact. Upper chest: Negative. Other: None. IMPRESSION: 1. No acute intracranial pathology. Small-vessel white matter disease. 2. No fracture or static subluxation of the cervical spine. Electronically Signed   By: Alex D Bibbey M.D.   On:Jearld Lesch2 11:41   DG Chest Port 1 View  Result Date: 06/14/2021 CLINICAL DATA:  Seizure. EXAM: PORTABLE CHEST 1 VIEW COMPARISON:  Chest x-ray dated 05/08/2021. FINDINGS: Heart size and mediastinal contours are stable. Lungs are clear. No pleural effusion or pneumothorax is seen. Pacer pad overlies the LEFT heart border. IMPRESSION: No active disease. No evidence  of pneumonia or pulmonary edema. Electronically Signed   By: Bary Richard M.D.   On: 06/14/2021 14:04   DG Hips Bilat W or Wo Pelvis 3-4 Views  Result Date: 06/14/2021 CLINICAL DATA:  Patient fell at nursing home. EXAM: DG HIP (WITH OR WITHOUT PELVIS) 3-4V BILAT COMPARISON:  None. FINDINGS: There is no evidence of hip fracture or dislocation. There is no evidence of arthropathy or other focal bone abnormality. IMPRESSION: Negative. Electronically Signed   By: Kennith Center M.D.   On: 06/14/2021 10:24    EKG: Independently reviewed. Ectopic rhythm with PACs, no acute changes.  Assessment/Plan Principal Problem:   Seizure (HCC) Active Problems:   Hyperlipidemia   Hypertension   GAD (generalized anxiety disorder)   Dementia without behavioral disturbance (HCC)  Seizure - witnessed seizure plus probable seizure at [facility]. Nl CT head. No underling infection or metabolic derangement. Neuro consulted - Dr. Amada Jupiter: recommended starting Depakote sprinkles. Plan Tele observation  EEG in AM  Continue Depakote  Neuro to see in follow up  2. Dementia - per son, rapidly progressive over past two months - since death of her spouse. At exam seemed delerious with agitation and hallucinosis. For several weeks she has been agitated and crying a lot. Plan Neuro consult - help with definitive dementia diagnosis  Continue lexapro and Klonipin  Haldol IV for severe agitation  May need long term antipsychotic therapy - explained risk  vs benefit to son  3. HLD - continue home med  4. HTN- stable.   5. GAD - see #2  6. Code status - son clear about wanting quality of life for his mother: peaceful, not agitated. She has preexisting DNR and he supports this - will continue DNR status    DVT prophylaxis: lovenox  Code Status: DNR  Family Communication: son was present during evaluation. Understands Dx and Tx plan.   Disposition Plan: return to SNF when stable  Consults called: Neuro - Dr.  Amada Jupiter Admission status: obs -tele   Illene Regulus MD [facility] Pager [phone]  If 7PM-7AM, please contact night-coverage [email or web address] Password [email or web address]  06/14/2021, 3:58 PM

## 2021-06-14 NOTE — ED Provider Notes (Addendum)
[facility] EMERGENCY DEPARTMENT Provider Note   CSN: [id number] Arrival date & time: 06/14/21  1256     History No chief complaint on file.   Joy Bender is a 70 y.o. female.  Patient was discharged following a fall out of a chair at the nursing facility.  Patient had CT head CT neck x-rays of both hips and pelvis as well as left elbow without any acute findings.  Patient was at her baseline dementia the whole time she was here and that was confirmed by her son.  Patient was discharged back to facility son was taking her in his truck.  He got her in the truck and then she started foaming at the mouth.  To be having seizure activity and become unresponsive.  Patient brought back in patient had a pulse patient was breathing on her own however she was not responsive.  No evidence of any tonic-clonic movement.  And patient's blood sugar was 108.  Patient's vital signs showed that she was tachycardic 100% nonrebreather oxygen sats were 100% and blood pressures were 149/107.      Past Medical History:  Diagnosis Date   Atypical chest pain    Status post admission in June 2012 ruled out for myocardial infarction status post stress echocardiogram without evidence for ischemia.   Dementia (HCC)    Hyperlipidemia    Lipid panel June 2012 cholesterol 214, triglycerides 144 HDL 40 LDL 145   Hypertension    Paroxysmal atrial fibrillation (HCC)    per Cardionet Monitor in 2009 only single episode of a brief run of atrial fibrillation noted on cardiac monitoring 2009 no clinical recurrence.    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   OB History     Gravida   3   Para  3   Term      Preterm      AB      Living  3      SAB      IAB      Ectopic      Multiple      Live Births              Family History  Problem Relation Age of Onset   Cancer Brother    Cancer Son        liver    Social History   Tobacco Use   Smoking status: Never   Smokeless tobacco: Never  Vaping Use   Vaping Use: Never used  Substance Use Topics   Alcohol use: No    Alcohol/week: 0.0 standard drinks   Drug use: No    Home Medications Prior to Admission medications   Medication Sig Start Date End Date Taking? Authorizing Provider  Cholecalciferol (D3-1000) 25 MCG (1000 UT) tablet Take 1,000 Units by mouth daily.    [provider]  ciprofloxacin (CIPRO) 250 MG tablet Take 250 mg by mouth 2 (two) times daily. Patient not taking: Reported on 05/31/2021    [provider]  clonazePAM (KLONOPIN) 0.5 MG tablet Take 0.5 mg by mouth 2 (two) times daily as needed for anxiety.    [provider]  escitalopram (LEXAPRO) 5 MG tablet Take 5 mg by mouth daily.    [provider]  loratadine (CLARITIN) 10 MG tablet Take 10 mg by mouth daily. Patient not taking: Reported on 05/31/2021    [provider]  magnesium hydroxide (MILK OF MAGNESIA) 400 MG/5ML suspension Take by mouth daily as needed for mild constipation.    [provider]  potassium chloride (KLOR-CON) 10 MEQ tablet  02/03/21   [provider]  sertraline (ZOLOFT) 50 MG tablet Take 1 tablet (50 mg total) by mouth daily. Patient not taking: Reported on 05/08/2021 08/01/19   Dettinger, Fransisca Kaufmann, MD  simvastatin (ZOCOR) 20 MG tablet Take 20 mg by mouth at bedtime.    [provider]  vitamin B-12 (CYANOCOBALAMIN) 1000 MCG tablet Take 1,000 mcg by mouth daily.    [provider]    Allergies    Patient has no known allergies.  Review of Systems   Review of Systems  Unable to perform ROS: Dementia   Physical  Exam Updated Vital Signs BP (!) 149/107   Pulse (!) 135   Resp (!) 25   SpO2 98%   Physical Exam Vitals and nursing note reviewed.  Constitutional:      General: She is in acute distress.     Appearance: She is well-developed.  HENT:     Head: Normocephalic and atraumatic.  Eyes:     Extraocular Movements: Extraocular movements intact.     Conjunctiva/sclera: Conjunctivae normal.     Pupils: Pupils are equal, round, and reactive to light.  Cardiovascular:     Rate and Rhythm: Regular rhythm. Tachycardia present.     Heart sounds: No murmur heard. Pulmonary:     Effort: Pulmonary effort is normal. No respiratory distress.     Breath sounds: Normal breath sounds.  Abdominal:     Palpations: Abdomen is soft.     Tenderness: There is no abdominal tenderness.  Musculoskeletal:        General: Signs of injury present. No swelling.     Cervical back: Neck supple.     Comments: Abrasion to left elbow  Skin:    General: Skin is warm and dry.     Capillary Refill: Capillary refill takes less than 2 seconds.  Neurological:     Mental Status: Mental status is at baseline.     Comments: Status now back to her baseline dementia  Psychiatric:        Mood and Affect: Mood normal.    ED Results / Procedures / Treatments   Labs (all labs ordered are listed, but only abnormal results are displayed) Labs Reviewed  CBC WITH DIFFERENTIAL/PLATELET - Abnormal; Notable for the following components:      Result Value   WBC 18.2 (*)    All other components within normal limits  CBG MONITORING, ED - Abnormal; Notable for the following components:   Glucose-Capillary 108 (*)    All other components within normal limits  RESP PANEL BY RT-PCR (FLU A&B, COVID) ARPGX2  COMPREHENSIVE METABOLIC PANEL  URINALYSIS, ROUTINE W REFLEX MICROSCOPIC  MAGNESIUM    EKG None  Radiology DG Elbow Complete Left  Result Date: 06/14/2021 CLINICAL DATA:  Fall, altered mental status, dementia. EXAM: LEFT  ELBOW - COMPLETE 3+ VIEW COMPARISON:  None. FINDINGS: Osseous alignment is normal. No fracture line or displaced fracture fragment is seen. No degenerative change. No evidence of joint effusion. Surrounding soft tissues are unremarkable. IMPRESSION: Negative. Electronically Signed  By: Franki Cabot M.D.   On: 06/14/2021 10:24   CT Head Wo Contrast  Result Date: 06/14/2021 CLINICAL DATA:  Head trauma, fall EXAM: CT HEAD WITHOUT CONTRAST CT CERVICAL SPINE WITHOUT CONTRAST TECHNIQUE: Multidetector CT imaging of the head and cervical spine was performed following the standard protocol without intravenous contrast. Multiplanar CT image reconstructions of the cervical spine were also generated. COMPARISON:  05/31/2021 FINDINGS: CT HEAD FINDINGS Brain: No evidence of acute infarction, hemorrhage, hydrocephalus, extra-axial collection or mass lesion/mass effect. Periventricular and deep white matter hypodensity. Vascular: No hyperdense vessel or unexpected calcification. Skull: Normal. Negative for fracture or focal lesion. Sinuses/Orbits: No acute finding. Other: None. CT CERVICAL SPINE FINDINGS Alignment: Straightening of the normal cervical lordosis, likely positional. Skull base and vertebrae: No acute fracture. No primary bone lesion or focal pathologic process. Soft tissues and spinal canal: No prevertebral fluid or swelling. No visible canal hematoma. Disc levels:  Intact. Upper chest: Negative. Other: None. IMPRESSION: 1. No acute intracranial pathology. Small-vessel white matter disease. 2. No fracture or static subluxation of the cervical spine. Electronically Signed   By: Delanna Ahmadi M.D.   On: 06/14/2021 11:41   CT Cervical Spine Wo Contrast  Result Date: 06/14/2021 CLINICAL DATA:  Head trauma, fall EXAM: CT HEAD WITHOUT CONTRAST CT CERVICAL SPINE WITHOUT CONTRAST TECHNIQUE: Multidetector CT imaging of the head and cervical spine was performed following the standard protocol without intravenous  contrast. Multiplanar CT image reconstructions of the cervical spine were also generated. COMPARISON:  05/31/2021 FINDINGS: CT HEAD FINDINGS Brain: No evidence of acute infarction, hemorrhage, hydrocephalus, extra-axial collection or mass lesion/mass effect. Periventricular and deep white matter hypodensity. Vascular: No hyperdense vessel or unexpected calcification. Skull: Normal. Negative for fracture or focal lesion. Sinuses/Orbits: No acute finding. Other: None. CT CERVICAL SPINE FINDINGS Alignment: Straightening of the normal cervical lordosis, likely positional. Skull base and vertebrae: No acute fracture. No primary bone lesion or focal pathologic process. Soft tissues and spinal canal: No prevertebral fluid or swelling. No visible canal hematoma. Disc levels:  Intact. Upper chest: Negative. Other: None. IMPRESSION: 1. No acute intracranial pathology. Small-vessel white matter disease. 2. No fracture or static subluxation of the cervical spine. Electronically Signed   By: Delanna Ahmadi M.D.   On: 06/14/2021 11:41   DG Hips Bilat W or Wo Pelvis 3-4 Views  Result Date: 06/14/2021 CLINICAL DATA:  Patient fell at nursing home. EXAM: DG HIP (WITH OR WITHOUT PELVIS) 3-4V BILAT COMPARISON:  None. FINDINGS: There is no evidence of hip fracture or dislocation. There is no evidence of arthropathy or other focal bone abnormality. IMPRESSION: Negative. Electronically Signed   By: Misty Stanley M.D.   On: 06/14/2021 10:24    Procedures Procedures   Medications Ordered in ED Medications  0.9 %  sodium chloride infusion (has no administration in time range)  LORazepam (ATIVAN) injection 1 mg (1 mg Intravenous Given by Other 06/14/21 1302)    ED Course  I have reviewed the triage vital signs and the nursing notes.  Pertinent labs & imaging results that were available during my care of the patient were reviewed by me and considered in my medical decision making (see chart for details).    MDM  Rules/Calculators/A&P                         CRITICAL CARE Performed by: Fredia Sorrow Total critical care time: 45 minutes Critical care time was exclusive of separately billable procedures and  treating other patients. Critical care was necessary to treat or prevent imminent or life-threatening deterioration. Critical care was time spent personally by me on the following activities: development of treatment plan with patient and/or surrogate as well as nursing, discussions with consultants, evaluation of patient's response to treatment, examination of patient, obtaining history from patient or surrogate, ordering and performing treatments and interventions, ordering and review of laboratory studies, ordering and review of radiographic studies, pulse oximetry and re-evaluation of patient's condition.   Patient was discharged following a fall at the nursing facility.  Work-up included CT head CT cervical spine x-rays of the pelvis and both hips and left elbow.  Patient was baseline the whole time she was here.  Patient was discharged got into the vehicle and some was taken her back to the nursing facility and she had what appeared to be seizure activity.  Possible it could have been vasovagal.  Patient does not seem to have any significant prolonged postictal phase no known history of seizures.  Now in retrospect it is possible that when she fell out of the chair at the nursing facility there could have been a seizure.  The son did state that she did have some foaming in the mouth when it occurred.  She was brought back into the emergency department replaced back in room 1.  Patient's initial blood pressure was okay heart rate was tachycardic but sinus oxygen saturations was 100% nonrebreather 100% at that time.  Patient was not alert and not her baseline dementia.  Seem to be some stiffness to lower extremities as well as upper extremities.  And patient slowly came around now she is back to her normal  baseline.  We will go ahead and reCT head do labs this time blood sugar was good which was reassuring at 108 CBC shows a white count of 18,000.  Highly suspect that this may have been a seizure.  Will discuss with neurologist once we get some more information back.  Patient was given Ativan 1 mg when she first was brought back.  For the possibility of seizure activity.  Discussed with Dr. Leonel Ramsay neurology.  He is going to formally evaluate her.  And then give recommendation whether we load her with Keppra.  He does think it probably was seizure activity.  Does think patient is stable for admission here.  And they can use teleneurology and she will need an EEG.   Final Clinical Impression(s) / ED Diagnoses Final diagnoses:  Seizure ([place])  Severe dementia, unspecified dementia type, unspecified whether behavioral, psychotic, or mood disturbance or anxiety    Rx / DC Orders ED Discharge Orders     None        Fredia Sorrow, MD 06/14/21 1328    Fredia Sorrow, MD 06/14/21 1516

## 2021-06-14 NOTE — ED Provider Notes (Signed)
[facility] EMERGENCY DEPARTMENT Provider Note   CSN: [id number] Arrival date & time: 06/14/21  [id number]     History Chief Complaint  Patient presents with   Tandy Gaw is a 70 y.o. female.  Patient from [place].  Patient apparently fell out of a chair today.  EMS reported skin tear to left elbow and a hematoma behind left ear.  Patient is not on any blood thinners.  Does have a history of atrial fibrillation.  Heart rate here 93.  Oxygen sats 98% blood pressure 142/85.  Temp 98.2.  Patient has significant dementia.  No change in her mental status.      Past Medical History:  Diagnosis Date   Atypical chest pain    Status post admission in June 2012 ruled out for myocardial infarction status post stress echocardiogram without evidence for ischemia.   Dementia ([place])    Hyperlipidemia    Lipid panel June 2012 cholesterol 214, triglycerides 144 HDL 40 LDL 145   Hypertension    Paroxysmal atrial fibrillation ([place])    per Cardionet Monitor in 2009 only single episode of a brief run of atrial fibrillation noted on cardiac monitoring 2009 no clinical recurrence.    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 No pertinent surgical history.   OB History     Gravida  3   Para  3   Term      Preterm      AB      Living  3      SAB      IAB      Ectopic      Multiple      Live Births              Family History  Problem Relation Age of Onset   Cancer Brother    Cancer Son        liver    Social History   Tobacco Use   Smoking status: Never   Smokeless tobacco: Never  Vaping Use   Vaping Use: Never used   Substance Use Topics   Alcohol use: No    Alcohol/week: 0.0 standard drinks   Drug use: No    Home Medications Prior to Admission medications   Medication Sig Start Date End Date Taking? Authorizing Provider  Cholecalciferol (D3-1000) 25 MCG (1000 UT) tablet Take 1,000 Units by mouth daily.    [provider]  ciprofloxacin (CIPRO) 250 MG tablet Take 250 mg by mouth 2 (two) times daily. Patient not taking: Reported on 05/31/2021    [provider]  clonazePAM (KLONOPIN) 0.5 MG tablet Take 0.5 mg by mouth 2 (two) times daily as needed for anxiety.    [provider]  escitalopram (LEXAPRO) 5 MG tablet Take 5 mg by mouth daily.    [provider]  loratadine (CLARITIN) 10 MG tablet Take 10 mg by mouth daily. Patient not taking: Reported on 05/31/2021    [provider]  magnesium hydroxide (MILK OF MAGNESIA) 400 MG/5ML suspension Take by mouth daily as needed for mild constipation.    [provider]  potassium chloride (KLOR-CON) 10  MEQ tablet  02/03/21   [provider]  sertraline (ZOLOFT) 50 MG tablet Take 1 tablet (50 mg total) by mouth daily. Patient not taking: Reported on 05/08/2021 08/01/19   Dettinger, Elige Radon, MD  simvastatin (ZOCOR) 20 MG tablet Take 20 mg by mouth at bedtime.    [provider]  vitamin B-12 (CYANOCOBALAMIN) 1000 MCG tablet Take 1,000 mcg by mouth daily.    [provider]    Allergies    Patient has no known allergies.  Review of Systems   Review of Systems  Unable to perform ROS: Dementia   Physical Exam Updated Vital Signs BP 121/67   Pulse 91   Temp 98.2 F (36.8 C) (Oral)   Resp 18   Ht 1.6 m (5\' 3" )   Wt 64 kg   SpO2 98%   BMI 24.99 kg/m   Physical Exam Vitals and nursing note reviewed.  Constitutional:      General: She is not in acute distress.    Appearance: She is well-developed.  HENT:     Head: Normocephalic.     Comments: Some contusion behind  left ear. Eyes:     Conjunctiva/sclera: Conjunctivae normal.  Neck:     Comments: Patient with cervical collar in place Cardiovascular:     Rate and Rhythm: Normal rate and regular rhythm.     Heart sounds: No murmur heard. Pulmonary:     Effort: Pulmonary effort is normal. No respiratory distress.     Breath sounds: Normal breath sounds.  Abdominal:     Palpations: Abdomen is soft.     Tenderness: There is no abdominal tenderness.  Musculoskeletal:        General: Signs of injury present. No swelling.     Comments: Abrasion to left elbow no significant swelling.  Radial pulse distally is 2+.  No evidence of any obvious right upper extremity or bilateral lower extremity injuries  Skin:    General: Skin is warm and dry.     Capillary Refill: Capillary refill takes less than 2 seconds.  Neurological:     Mental Status: She is alert. Mental status is at baseline.  Psychiatric:        Mood and Affect: Mood normal.    ED Results / Procedures / Treatments   Labs (all labs ordered are listed, but only abnormal results are displayed) Labs Reviewed - No data to display  EKG EKG Interpretation  Date/Time:  Sunday June 14 2021 09:26:19 EST Ventricular Rate:  88 PR Interval:  65 QRS Duration: 73 QT Interval:  353 QTC Calculation: 428 R Axis:   66 Text Interpretation: Sinus or ectopic atrial rhythm Atrial premature complexes Short PR interval Abnormal R-wave progression, early transition Nonspecific T abnormalities, diffuse leads Confirmed by 07-27-1985 [phone]) on 06/14/2021 9:33:10 AM  Radiology DG Elbow Complete Left  Result Date: 06/14/2021 CLINICAL DATA:  Fall, altered mental status, dementia. EXAM: LEFT ELBOW - COMPLETE 3+ VIEW COMPARISON:  None. FINDINGS: Osseous alignment is normal. No fracture line or displaced fracture fragment is seen. No degenerative change. No evidence of joint effusion. Surrounding soft tissues are unremarkable. IMPRESSION: Negative.  Electronically Signed   By: 08[birth year]07[birth year]1970 M.D.   On: 06/14/2021 10:24   CT Head Wo Contrast  Result Date: 06/14/2021 CLINICAL DATA:  Head trauma, fall EXAM: CT HEAD WITHOUT CONTRAST CT CERVICAL SPINE WITHOUT CONTRAST TECHNIQUE: Multidetector CT imaging of the head and cervical spine was performed following the standard protocol without intravenous contrast. Multiplanar  CT image reconstructions of the cervical spine were also generated. COMPARISON:  05/31/2021 FINDINGS: CT HEAD FINDINGS Brain: No evidence of acute infarction, hemorrhage, hydrocephalus, extra-axial collection or mass lesion/mass effect. Periventricular and deep white matter hypodensity. Vascular: No hyperdense vessel or unexpected calcification. Skull: Normal. Negative for fracture or focal lesion. Sinuses/Orbits: No acute finding. Other: None. CT CERVICAL SPINE FINDINGS Alignment: Straightening of the normal cervical lordosis, likely positional. Skull base and vertebrae: No acute fracture. No primary bone lesion or focal pathologic process. Soft tissues and spinal canal: No prevertebral fluid or swelling. No visible canal hematoma. Disc levels:  Intact. Upper chest: Negative. Other: None. IMPRESSION: 1. No acute intracranial pathology. Small-vessel white matter disease. 2. No fracture or static subluxation of the cervical spine. Electronically Signed   By: Delanna Ahmadi M.D.   On: 06/14/2021 11:41   CT Cervical Spine Wo Contrast  Result Date: 06/14/2021 CLINICAL DATA:  Head trauma, fall EXAM: CT HEAD WITHOUT CONTRAST CT CERVICAL SPINE WITHOUT CONTRAST TECHNIQUE: Multidetector CT imaging of the head and cervical spine was performed following the standard protocol without intravenous contrast. Multiplanar CT image reconstructions of the cervical spine were also generated. COMPARISON:  05/31/2021 FINDINGS: CT HEAD FINDINGS Brain: No evidence of acute infarction, hemorrhage, hydrocephalus, extra-axial collection or mass lesion/mass effect.  Periventricular and deep white matter hypodensity. Vascular: No hyperdense vessel or unexpected calcification. Skull: Normal. Negative for fracture or focal lesion. Sinuses/Orbits: No acute finding. Other: None. CT CERVICAL SPINE FINDINGS Alignment: Straightening of the normal cervical lordosis, likely positional. Skull base and vertebrae: No acute fracture. No primary bone lesion or focal pathologic process. Soft tissues and spinal canal: No prevertebral fluid or swelling. No visible canal hematoma. Disc levels:  Intact. Upper chest: Negative. Other: None. IMPRESSION: 1. No acute intracranial pathology. Small-vessel white matter disease. 2. No fracture or static subluxation of the cervical spine. Electronically Signed   By: Delanna Ahmadi M.D.   On: 06/14/2021 11:41   DG Hips Bilat W or Wo Pelvis 3-4 Views  Result Date: 06/14/2021 CLINICAL DATA:  Patient fell at nursing home. EXAM: DG HIP (WITH OR WITHOUT PELVIS) 3-4V BILAT COMPARISON:  None. FINDINGS: There is no evidence of hip fracture or dislocation. There is no evidence of arthropathy or other focal bone abnormality. IMPRESSION: Negative. Electronically Signed   By: Misty Stanley M.D.   On: 06/14/2021 10:24    Procedures Procedures   Medications Ordered in ED Medications - No data to display  ED Course  I have reviewed the triage vital signs and the nursing notes.  Pertinent labs & imaging results that were available during my care of the patient were reviewed by me and considered in my medical decision making (see chart for details).    MDM Rules/Calculators/A&P                           Ring to son patient is now wheelchair-bound.  States that her mental status is unchanged.  CT head CT cervical spine without any acute injuries.  X-rays of the left elbow pelvis and both hips without any acute injuries.  Abrasion to the left elbow can be treated with wound care.   Final Clinical Impression(s) / ED Diagnoses Final diagnoses:   Fall, initial encounter  Injury of head, initial encounter  Abrasion of left elbow, initial encounter    Rx / DC Orders ED Discharge Orders     None  Fredia Sorrow, MD 06/14/21 1234

## 2021-06-14 NOTE — ED Notes (Signed)
Patient transported to CT 

## 2021-06-14 NOTE — ED Triage Notes (Signed)
Pt was being discharged with son to go back to [facility] after just being discharged from the ED. As NT was assisting pt into the chair, NT reports pt got very tense, starred off and was drooling out of her mouth. Pulse check was done and no pulse felt. RN arrived outside to pt and found pt to have a pulse. Pt then taken back into ED for further evaluation.

## 2021-06-15 ENCOUNTER — Inpatient Hospital Stay (HOSPITAL_COMMUNITY)
Admit: 2021-06-15 | Discharge: 2021-06-15 | Disposition: A | Discharge status: Home / Self Care | Payer: Medicare Other | Attending: Neurology | Admitting: Neurology

## 2021-06-15 DIAGNOSIS — R569 Unspecified convulsions: Secondary | ICD-10-CM | POA: Diagnosis present

## 2021-06-15 DIAGNOSIS — G9341 Metabolic encephalopathy: Secondary | ICD-10-CM | POA: Diagnosis present

## 2021-06-15 DIAGNOSIS — Z20822 Contact with and (suspected) exposure to covid-19: Secondary | ICD-10-CM | POA: Diagnosis present

## 2021-06-15 DIAGNOSIS — I10 Essential (primary) hypertension: Secondary | ICD-10-CM

## 2021-06-15 DIAGNOSIS — S51012A Laceration without foreign body of left elbow, initial encounter: Secondary | ICD-10-CM | POA: Diagnosis present

## 2021-06-15 DIAGNOSIS — Z79899 Other long term (current) drug therapy: Secondary | ICD-10-CM | POA: Diagnosis not present

## 2021-06-15 DIAGNOSIS — W07XXXA Fall from chair, initial encounter: Secondary | ICD-10-CM | POA: Diagnosis present

## 2021-06-15 DIAGNOSIS — F03C4 Unspecified dementia, severe, with anxiety: Secondary | ICD-10-CM | POA: Diagnosis present

## 2021-06-15 DIAGNOSIS — I480 Paroxysmal atrial fibrillation: Secondary | ICD-10-CM | POA: Diagnosis present

## 2021-06-15 DIAGNOSIS — E7220 Disorder of urea cycle metabolism, unspecified: Secondary | ICD-10-CM | POA: Diagnosis present

## 2021-06-15 DIAGNOSIS — E785 Hyperlipidemia, unspecified: Secondary | ICD-10-CM | POA: Diagnosis present

## 2021-06-15 DIAGNOSIS — S0083XA Contusion of other part of head, initial encounter: Secondary | ICD-10-CM | POA: Diagnosis present

## 2021-06-15 DIAGNOSIS — G40909 Epilepsy, unspecified, not intractable, without status epilepticus: Secondary | ICD-10-CM | POA: Diagnosis present

## 2021-06-15 DIAGNOSIS — D72823 Leukemoid reaction: Secondary | ICD-10-CM | POA: Diagnosis present

## 2021-06-15 DIAGNOSIS — F411 Generalized anxiety disorder: Secondary | ICD-10-CM | POA: Diagnosis present

## 2021-06-15 DIAGNOSIS — F0390 Unspecified dementia without behavioral disturbance: Secondary | ICD-10-CM

## 2021-06-15 DIAGNOSIS — Z66 Do not resuscitate: Secondary | ICD-10-CM | POA: Diagnosis present

## 2021-06-15 LAB — URINALYSIS, MICROSCOPIC (REFLEX)

## 2021-06-15 LAB — CBC
HCT: 33.3 % — ABNORMAL LOW (ref 36.0–46.0)
Hemoglobin: 11.3 g/dL — ABNORMAL LOW (ref 12.0–15.0)
MCH: 32.5 pg (ref 26.0–34.0)
MCHC: 33.9 g/dL (ref 30.0–36.0)
MCV: 95.7 fL (ref 80.0–100.0)
Platelets: 221 10*3/uL (ref 150–400)
RBC: 3.48 MIL/uL — ABNORMAL LOW (ref 3.87–5.11)
RDW: 13.7 % (ref 11.5–15.5)
WBC: 7.5 10*3/uL (ref 4.0–10.5)
nRBC: 0 % (ref 0.0–0.2)

## 2021-06-15 LAB — URINALYSIS, ROUTINE W REFLEX MICROSCOPIC
Bilirubin Urine: NEGATIVE
Glucose, UA: NEGATIVE mg/dL
Ketones, ur: 15 mg/dL — AB
Nitrite: NEGATIVE
Protein, ur: NEGATIVE mg/dL
Specific Gravity, Urine: 1.015 (ref 1.005–1.030)
pH: 7 (ref 5.0–8.0)

## 2021-06-15 LAB — TSH: TSH: 0.969 u[IU]/mL (ref 0.350–4.500)

## 2021-06-15 LAB — T4, FREE: Free T4: 0.81 ng/dL (ref 0.61–1.12)

## 2021-06-15 LAB — LIPID PANEL
Cholesterol: 127 mg/dL (ref 0–200)
HDL: 41 mg/dL (ref >40)
LDL Cholesterol: 78 mg/dL (ref 0–99)
Total CHOL/HDL Ratio: 3.1 RATIO
Triglycerides: 42 mg/dL (ref <150)
VLDL: 8 mg/dL (ref 0–40)

## 2021-06-15 LAB — AMMONIA: Ammonia: 26 umol/L (ref 9–35)

## 2021-06-15 LAB — VITAMIN B12: Vitamin B-12: 1220 pg/mL — ABNORMAL HIGH (ref 180–914)

## 2021-06-15 LAB — FOLATE: Folate: 10.8 ng/mL (ref >5.9)

## 2021-06-15 MED ORDER — SODIUM CHLORIDE 0.9 % IV SOLN: INTRAVENOUS | Status: DC

## 2021-06-15 NOTE — Progress Notes (Signed)
PROGRESS NOTE  Joy NATHANSON [id number] DOB: March 02, 1951 DOA: 06/14/2021 PCP: [name], MD  Brief History:  70 year old female with a history of dementia, hypertension, hyperlipidemia, paroxysmal atrial fibrillation, anxiety presenting from [facility] after falling out of her chair.  The patient came to the emergency department via ambulance and had lab work performed as well as radiographs of her lumbar spine, hips and elbow which were all unremarkable.  CT of the brain was negative.  Patient's son stated that she was at her usual mental baseline at that time.  The patient was being discharged from the emergency department.  Her son was getting her into a truck when the patient " stiffened up" and was foaming at the mouth per the son.  She was just staring into space and not responding.  She was brought back into the emergency department.  Her vital signs were stable.  Blood work was performed and showed WBC 18.2, hemoglobin 13.7, platelets 137,000.  She was more lethargic than usual.  She was given Ativan 1 mg in the emergency department. Teleneurology saw the patient and recommended admission and loading with Depakote. Unfortunately, the patient was agitated overnight 06/14/2021 to 06/15/2021, and she was given Haldol 1 mg and Klonopin  Assessment/Plan: Acute metabolic encephalopathy -Secondary to seizure -The patient was somnolent at the time of my evaluation -She does arouse with voice -Patient's son states that she is improving but not at baseline -UA negative for pyuria -CT brain negative for acute findings -CT cervical spine negative for fracture or subluxation -minimize any sedating/hypnotic meds  Seizure disorder -Obtain EEG -Continue Depakote -Neurology consult  Leukemoid reaction -Likely secondary to stress demargination -UA negative for pyuria -Chest x-ray without any infiltrates  Anxiety -continue celexa           Family Communication:    son updated at bedside 12/5  Consultants:  neurology  Code Status:  DNR  DVT Prophylaxis:  [place] Lovenox   Procedures: As Listed in Progress Note Above  Antibiotics: Bender       Subjective: Unobtainable due to her dementia  Objective: Vitals:   06/14/21 1630 06/14/21 1701 06/14/21 2053 06/15/21 0437  BP: (!) 151/78 (!) 153/81 107/77 (!) 148/75  Pulse: 94 94 90 76  Resp: 20  18 18   Temp: 98 F (36.7 C) 99.3 F (37.4 C) 99.4 F (37.4 C) 98.3 F (36.8 C)  TempSrc: Temporal     SpO2: 97% 99% 95% 99%  Weight:      Height:        Intake/Output Summary (Last 24 hours) at 06/15/2021 1047 Last data filed at 06/15/2021 0700 Gross per 24 hour  Intake 1484.93 ml  Output --  Net 1484.93 ml   Weight change:  Exam:  General:  Pt is alert, does not follow commands appropriately, not in acute distress HEENT: No icterus, No thrush, No neck mass, [place]/AT Cardiovascular: RRR, S1/S2, no rubs, no gallops Respiratory: poor inspiratory effort, bibasilar rales. Abdomen: Soft/+BS, non tender, non distended, no guarding Extremities: No edema, No lymphangitis, No petechiae, No rashes, no synovitis   Data Reviewed: I have personally reviewed following labs and imaging studies Basic Metabolic Panel: Recent Labs  Lab 06/14/21 1354  NA 138  K 4.4  CL 104  CO2 23  GLUCOSE 106*  BUN 16  CREATININE 0.65  CALCIUM 8.6*  MG 2.1   Liver Function Tests: Recent Labs  Lab 06/14/21 1354  AST 34  ALT 24  ALKPHOS 97  BILITOT 0.6  PROT 6.5  ALBUMIN 3.3*   No results for input(s): LIPASE, AMYLASE in the last 168 hours. No results for input(s): AMMONIA in the last 168 hours. Coagulation Profile: No results for input(s): INR, PROTIME in the last 168 hours. CBC: Recent Labs  Lab 06/14/21 1306  WBC 18.2*  NEUTROABS 11.6*  HGB 13.7  HCT 42.1  MCV 97.9  PLT 337   Cardiac Enzymes: No results for input(s): CKTOTAL, CKMB, CKMBINDEX, TROPONINI in the last 168 hours. BNP: Invalid  input(s): POCBNP CBG: Recent Labs  Lab 06/14/21 1258  GLUCAP 108*   HbA1C: No results for input(s): HGBA1C in the last 72 hours. Urine analysis:    Component Value Date/Time   COLORURINE YELLOW 06/15/2021 0130   APPEARANCEUR CLOUDY (A) 06/15/2021 0130   LABSPEC 1.015 06/15/2021 0130   PHURINE 7.0 06/15/2021 0130   GLUCOSEU NEGATIVE 06/15/2021 0130   HGBUR TRACE (A) 06/15/2021 0130   BILIRUBINUR NEGATIVE 06/15/2021 0130   KETONESUR 15 (A) 06/15/2021 0130   PROTEINUR NEGATIVE 06/15/2021 0130   NITRITE NEGATIVE 06/15/2021 0130   LEUKOCYTESUR TRACE (A) 06/15/2021 0130   Sepsis Labs: @LABRCNTIP (procalcitonin:4,lacticidven:4) ) Recent Results (from the past 240 hour(s))  Resp Panel by RT-PCR (Flu A&B, Covid) Nasopharyngeal Swab     Status: Bender   Collection Time: 06/14/21  1:58 PM   Specimen: Nasopharyngeal Swab; Nasopharyngeal(NP) swabs in vial transport medium  Result Value Ref Range Status   SARS Coronavirus 2 by RT PCR NEGATIVE NEGATIVE Final    Comment: (NOTE) SARS-CoV-2 target nucleic acids are NOT DETECTED.  The SARS-CoV-2 RNA is generally detectable in upper respiratory specimens during the acute phase of infection. The lowest concentration of SARS-CoV-2 viral copies this assay can detect is 138 copies/mL. A negative result does not preclude SARS-Cov-2 infection and should not be used as the sole basis for treatment or other patient management decisions. A negative result may occur with  improper specimen collection/handling, submission of specimen other than nasopharyngeal swab, presence of viral mutation(s) within the areas targeted by this assay, and inadequate number of viral copies(<138 copies/mL). A negative result must be combined with clinical observations, patient history, and epidemiological information. The expected result is Negative.  Fact Sheet for Patients:  EntrepreneurPulse.com.au  Fact Sheet for Healthcare Providers:   IncredibleEmployment.be  This test is no t yet approved or cleared by the [place] FDA and  has been authorized for detection and/or diagnosis of SARS-CoV-2 by FDA under an Emergency Use Authorization (EUA). This EUA will remain  in effect (meaning this test can be used) for the duration of the COVID-19 declaration under Section 564(b)(1) of the Act, 21 U.S.C.section 360bbb-3(b)(1), unless the authorization is terminated  or revoked sooner.       Influenza A by PCR NEGATIVE NEGATIVE Final   Influenza B by PCR NEGATIVE NEGATIVE Final    Comment: (NOTE) The Xpert Xpress SARS-CoV-2/FLU/RSV plus assay is intended as an aid in the diagnosis of influenza from Nasopharyngeal swab specimens and should not be used as a sole basis for treatment. Nasal washings and aspirates are unacceptable for Xpert Xpress SARS-CoV-2/FLU/RSV testing.  Fact Sheet for Patients: EntrepreneurPulse.com.au  Fact Sheet for Healthcare Providers: IncredibleEmployment.be  This test is not yet approved or cleared by the [place] FDA and has been authorized for detection and/or diagnosis of SARS-CoV-2 by FDA under an Emergency Use Authorization (EUA). This EUA will remain in effect (meaning this test can be used) for the  duration of the COVID-19 declaration under Section 564(b)(1) of the Act, 21 U.S.C. section 360bbb-3(b)(1), unless the authorization is terminated or revoked.  Performed at [facility], [address], [place], [place] [address]   MRSA Next Gen by PCR, Nasal     Status: Bender   Collection Time: 06/14/21  5:57 PM   Specimen: Nasal Mucosa; Nasal Swab  Result Value Ref Range Status   MRSA by PCR Next Gen NOT DETECTED NOT DETECTED Final    Comment: (NOTE) The GeneXpert MRSA Assay (FDA approved for NASAL specimens only), is one component of a comprehensive MRSA colonization surveillance program. It is not intended to diagnose MRSA  infection nor to guide or monitor treatment for MRSA infections. Test performance is not FDA approved in patients less than 26 years old. Performed at [facility], [address] [place], [place] [address]      Scheduled Meds:  citalopram  20 mg Oral Daily   divalproex  500 mg Oral Q12H   enoxaparin (LOVENOX) injection  40 mg Subcutaneous Q24H   senna  1 tablet Oral BID   simvastatin  20 mg Oral QHS   vitamin B-12  1,000 mcg Oral Daily   Continuous Infusions:  sodium chloride Stopped (06/14/21 1614)   sodium chloride 75 mL/hr (06/15/21 0625)    Procedures/Studies: DG Elbow Complete Left  Result Date: 06/14/2021 CLINICAL DATA:  Fall, altered mental status, dementia. EXAM: LEFT ELBOW - COMPLETE 3+ VIEW COMPARISON:  Bender. FINDINGS: Osseous alignment is normal. No fracture line or displaced fracture fragment is seen. No degenerative change. No evidence of joint effusion. Surrounding soft tissues are unremarkable. IMPRESSION: Negative. Electronically Signed   By: Bary Richard M.D.   On: 06/14/2021 10:24   CT Head Wo Contrast  Result Date: 06/14/2021 CLINICAL DATA:  Seizure EXAM: CT HEAD WITHOUT CONTRAST TECHNIQUE: Contiguous axial images were obtained from the base of the skull through the vertex without intravenous contrast. COMPARISON:  06/14/2021 at 10:57 a.m. FINDINGS: Brain: As on the prior exam, the patient was not able to assist with positioning and as result, the axial images are somewhat distorted by head tilt and head turning. Motion artifact noted. Periventricular white matter and corona radiata hypodensities favor chronic ischemic microvascular white matter disease. Otherwise, the brainstem, cerebellum, cerebral peduncles, thalamus, basal ganglia, basilar cisterns, and ventricular system appear within normal limits. No intracranial hemorrhage, mass lesion, or acute CVA. Vascular: There is atherosclerotic calcification of the cavernous carotid arteries bilaterally. Skull:  Unremarkable Sinuses/Orbits: Unremarkable Other: No supplemental non-categorized findings. IMPRESSION: 1. No acute intracranial findings. 2. Periventricular white matter and corona radiata hypodensities favor chronic ischemic microvascular white matter disease. Electronically Signed   By: Gaylyn Rong M.D.   On: 06/14/2021 13:39   CT Head Wo Contrast  Result Date: 06/14/2021 CLINICAL DATA:  Head trauma, fall EXAM: CT HEAD WITHOUT CONTRAST CT CERVICAL SPINE WITHOUT CONTRAST TECHNIQUE: Multidetector CT imaging of the head and cervical spine was performed following the standard protocol without intravenous contrast. Multiplanar CT image reconstructions of the cervical spine were also generated. COMPARISON:  05/31/2021 FINDINGS: CT HEAD FINDINGS Brain: No evidence of acute infarction, hemorrhage, hydrocephalus, extra-axial collection or mass lesion/mass effect. Periventricular and deep white matter hypodensity. Vascular: No hyperdense vessel or unexpected calcification. Skull: Normal. Negative for fracture or focal lesion. Sinuses/Orbits: No acute finding. Other: Bender. CT CERVICAL SPINE FINDINGS Alignment: Straightening of the normal cervical lordosis, likely positional. Skull base and vertebrae: No acute fracture. No primary bone lesion or focal pathologic  process. Soft tissues and spinal canal: No prevertebral fluid or swelling. No visible canal hematoma. Disc levels:  Intact. Upper chest: Negative. Other: Bender. IMPRESSION: 1. No acute intracranial pathology. Small-vessel white matter disease. 2. No fracture or static subluxation of the cervical spine. Electronically Signed   By: Delanna Ahmadi M.D.   On: 06/14/2021 11:41   CT Head Wo Contrast  Result Date: 05/31/2021 CLINICAL DATA:  [place], left-sided scalp hematoma, dimension EXAM: CT HEAD WITHOUT CONTRAST TECHNIQUE: Contiguous axial images were obtained from the base of the skull through the vertex without intravenous contrast. COMPARISON:  01/01/2021  FINDINGS: Brain: No acute infarct or hemorrhage. Scattered hypodensities in the periventricular white matter are stable, consistent with chronic small vessel ischemic change. Lateral ventricles and midline structures are otherwise unremarkable. No acute extra-axial fluid collections. No mass effect. Vascular: No hyperdense vessel or unexpected calcification. Skull: Minimal scalp edema along the left parietal convexity. No acute calvarial fracture. Sinuses/Orbits: No acute finding. Other: Bender. IMPRESSION: 1. Stable head CT, no acute intracranial process. Electronically Signed   By: Randa Ngo M.D.   On: 05/31/2021 20:05   CT Cervical Spine Wo Contrast  Result Date: 06/14/2021 CLINICAL DATA:  Head trauma, fall EXAM: CT HEAD WITHOUT CONTRAST CT CERVICAL SPINE WITHOUT CONTRAST TECHNIQUE: Multidetector CT imaging of the head and cervical spine was performed following the standard protocol without intravenous contrast. Multiplanar CT image reconstructions of the cervical spine were also generated. COMPARISON:  05/31/2021 FINDINGS: CT HEAD FINDINGS Brain: No evidence of acute infarction, hemorrhage, hydrocephalus, extra-axial collection or mass lesion/mass effect. Periventricular and deep white matter hypodensity. Vascular: No hyperdense vessel or unexpected calcification. Skull: Normal. Negative for fracture or focal lesion. Sinuses/Orbits: No acute finding. Other: Bender. CT CERVICAL SPINE FINDINGS Alignment: Straightening of the normal cervical lordosis, likely positional. Skull base and vertebrae: No acute fracture. No primary bone lesion or focal pathologic process. Soft tissues and spinal canal: No prevertebral fluid or swelling. No visible canal hematoma. Disc levels:  Intact. Upper chest: Negative. Other: Bender. IMPRESSION: 1. No acute intracranial pathology. Small-vessel white matter disease. 2. No fracture or static subluxation of the cervical spine. Electronically Signed   By: Delanna Ahmadi M.D.   On:  06/14/2021 11:41   DG Chest Port 1 View  Result Date: 06/14/2021 CLINICAL DATA:  Seizure. EXAM: PORTABLE CHEST 1 VIEW COMPARISON:  Chest x-ray dated 05/08/2021. FINDINGS: Heart size and mediastinal contours are stable. Lungs are clear. No pleural effusion or pneumothorax is seen. Pacer pad overlies the LEFT heart border. IMPRESSION: No active disease. No evidence of pneumonia or pulmonary edema. Electronically Signed   By: Franki Cabot M.D.   On: 06/14/2021 14:04   DG Hips Bilat W or Wo Pelvis 3-4 Views  Result Date: 06/14/2021 CLINICAL DATA:  Patient fell at nursing home. EXAM: DG HIP (WITH OR WITHOUT PELVIS) 3-4V BILAT COMPARISON:  Bender. FINDINGS: There is no evidence of hip fracture or dislocation. There is no evidence of arthropathy or other focal bone abnormality. IMPRESSION: Negative. Electronically Signed   By: Misty Stanley M.D.   On: 06/14/2021 10:24    Orson Eva, DO  [facility]  If 7PM-7AM, please contact night-coverage [email or web address] Password [email or web address] 06/15/2021, 10:47 AM   LOS: 0 days

## 2021-06-15 NOTE — Procedures (Signed)
Patient Name: Joy Bender  MRN: [id number]  Epilepsy Attending: Charlsie Quest  Referring Physician/Provider: Dr. Illene Regulus Date: 06/15/2021 Duration:   Patient history: 70 year old female with history of dementia presented with new onset seizures.  EEG to evaluate for seizure.  Level of alertness: Lethargic, asleep  AEDs during EEG study: VPA  Technical aspects: This EEG study was done with scalp electrodes positioned according to the 10-20 International system of electrode placement. Electrical activity was acquired at a sampling rate of 500Hz  and reviewed with a high frequency filter of 70Hz  and a low frequency filter of 1Hz . EEG data were recorded continuously and digitally stored.   Description: No clear posterior dominant rhythm was seen. Sleep was characterized by sleep spindles (12 to 14 Hz), maximal frontocentral region.  EEG showed continuous generalized 5 to 6 Hz theta as well as intermittent generalized 2 to 3 Hz delta slowing, at times or triphasic morphology.  Hyperventilation and photic stimulation were not performed.     ABNORMALITY - Continuous slow, generalized  IMPRESSION: This study is suggestive of moderate diffuse encephalopathy, nonspecific etiology but could be secondary to toxic-metabolic causes.  No seizures or definite epileptiform discharges were seen throughout the recording.  [name] 

## 2021-06-15 NOTE — Progress Notes (Signed)
EEG completed, results pending. 

## 2021-06-15 NOTE — Progress Notes (Signed)
I connected with  Joy Bender on 06/15/21 by a video enabled telemedicine application and verified that I am speaking with the correct person using two identifiers.   I discussed the limitations of evaluation and management by telemedicine. The patient expressed understanding and agreed to proceed.  Location of patient: [facility] Location of physician: [facility]  Subjective: No acute events overnight.  Patient is very sleepy.  Patient's son and daughter-in-law at bedside state she did wake up and ate a few bites this morning but has been more sleepy than usual.  At baseline she states that she is at nursing home and will communicate when asked questions but does not initiate conversations.  Per family she has also been more emotional and crying as well as her appetite has gone down over the last few weeks.   ROS: negative except above  Examination  Vital signs in last 24 hours: Temp:  [97.8 F (36.6 C)-99.4 F (37.4 C)] 97.8 F (36.6 C) (12/05 1227) Pulse Rate:  [76-135] 82 (12/05 1227) Resp:  [16-29] 16 (12/05 1227) BP: (107-162)/(75-145) 115/75 (12/05 1227) SpO2:  [95 %-100 %] 100 % (12/05 1227) Weight:  [65 kg] 65 kg (12/04 1602)  General: lying in bed, NAD Neuro: Patient asleep, does wake up to stimulation but falls back asleep quickly   Basic Metabolic Panel: Recent Labs  Lab 06/14/21 1354  NA 138  K 4.4  CL 104  CO2 23  GLUCOSE 106*  BUN 16  CREATININE 0.65  CALCIUM 8.6*  MG 2.1    CBC: Recent Labs  Lab 06/14/21 1306 06/15/21 1116  WBC 18.2* 7.5  NEUTROABS 11.6*  --   HGB 13.7 11.3*  HCT 42.1 33.3*  MCV 97.9 95.7  PLT 337 221     Coagulation Studies: No results for input(s): LABPROT, INR in the last 72 hours.  Imaging  CT head without contrast 06/15/2019: No acute abnormality. Periventricular white matter and corona radiata hypodensities favor chronic ischemic microvascular white matter disease.   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  Recommendations -Patient had 2 seizures within 24 hours.  However, due to dementia her risk of seizure recurrence is higher. -Routine EEG is pending. -MRI brain will be ideal but patient is demented and will not be able to cooperate.  Therefore will defer for now. -Recommend continuing Depakote 500 mg twice daily as it will also help with mood stabilization (family reporting crying spells for the last few weeks) -We will check Depakote levels and ammonia levels.  If patient is back to baseline tomorrow, will continue same dose of Depakote.  However patient remains excessively drowsy, can consider lowering Depakote dose to 250 mg-500 mg. -Continue seizure precautions -If routine EEG is within normal limits, patient is back to baseline and ammonia levels are normal, patient will be ready for discharge from neurology standpoint. -Recommend follow-up with neurology in 8 to 10 weeks.  Thank you for allowing [place] to participate in the care of this patient.  Please call us for any further questions.  I have spent a total of  26 minutes with the patient reviewing hospital notes,  test results, labs and examining the patient as well as establishing an assessment and plan that was discussed personally with the patient's team.  > 50% of time was spent in direct patient care.   Lindie Spruce  Epilepsy [facility] For questions after 5pm please refer to AMION to reach the Neurologist on call

## 2021-06-16 DIAGNOSIS — G9341 Metabolic encephalopathy: Secondary | ICD-10-CM

## 2021-06-16 LAB — CBC
HCT: 35.5 % — ABNORMAL LOW (ref 36.0–46.0)
Hemoglobin: 11.7 g/dL — ABNORMAL LOW (ref 12.0–15.0)
MCH: 31.7 pg (ref 26.0–34.0)
MCHC: 33 g/dL (ref 30.0–36.0)
MCV: 96.2 fL (ref 80.0–100.0)
Platelets: 203 10*3/uL (ref 150–400)
RBC: 3.69 MIL/uL — ABNORMAL LOW (ref 3.87–5.11)
RDW: 13.6 % (ref 11.5–15.5)
WBC: 6.3 10*3/uL (ref 4.0–10.5)
nRBC: 0 % (ref 0.0–0.2)

## 2021-06-16 LAB — BASIC METABOLIC PANEL
Anion gap: 7 (ref 5–15)
BUN: 13 mg/dL (ref 8–23)
CO2: 23 mmol/L (ref 22–32)
Calcium: 8 mg/dL — ABNORMAL LOW (ref 8.9–10.3)
Chloride: 109 mmol/L (ref 98–111)
Creatinine, Ser: 0.56 mg/dL (ref 0.44–1.00)
GFR, Estimated: >60 mL/min (ref >60)
Glucose, Bld: 78 mg/dL (ref 70–99)
Potassium: 3.3 mmol/L — ABNORMAL LOW (ref 3.5–5.1)
Sodium: 139 mmol/L (ref 135–145)

## 2021-06-16 LAB — LIPID PANEL
Cholesterol: 131 mg/dL (ref 0–200)
HDL: 42 mg/dL (ref >40)
LDL Cholesterol: 78 mg/dL (ref 0–99)
Total CHOL/HDL Ratio: 3.1 RATIO
Triglycerides: 53 mg/dL (ref <150)
VLDL: 11 mg/dL (ref 0–40)

## 2021-06-16 LAB — AMMONIA: Ammonia: 44 umol/L — ABNORMAL HIGH (ref 9–35)

## 2021-06-16 LAB — VALPROIC ACID LEVEL: Valproic Acid Lvl: 78 ug/mL (ref 50.0–100.0)

## 2021-06-16 MED ORDER — DIVALPROEX SODIUM 125 MG PO CSDR: 500.0000 mg | DELAYED_RELEASE_CAPSULE | Freq: Every day | ORAL | Status: DC

## 2021-06-16 MED ORDER — DIVALPROEX SODIUM 125 MG PO CSDR: 500.0000 mg | DELAYED_RELEASE_CAPSULE | Freq: Every day | ORAL | 0 refills | Status: AC

## 2021-06-16 MED ORDER — DIVALPROEX SODIUM 125 MG PO CSDR: 250.0000 mg | DELAYED_RELEASE_CAPSULE | Freq: Every morning | ORAL | Status: DC

## 2021-06-16 MED ORDER — DIVALPROEX SODIUM 125 MG PO CSDR: 250.0000 mg | DELAYED_RELEASE_CAPSULE | Freq: Every morning | ORAL | 0 refills | Status: AC

## 2021-06-16 NOTE — [place] FL2 (Signed)
[place] MEDICAID FL2 LEVEL OF CARE SCREENING TOOL     IDENTIFICATION  Patient Name: Joy Bender Birthdate: [birth year] Sex: female Admission Date (Current Location): 06/14/2021  [facility] and [place] Number:  [employer] and Address:  [facility],  [address] [place] [address]      Provider Number: [id number]  Attending Physician Name and Address:  No att. providers found  Relative Name and Phone Number:  Mortimer Fries [phone]    Current Level of Care: Hospital Recommended Level of Care: Memory Care, Other (Comment) (secure care unit) Prior Approval Number:    Date Approved/Denied:   PASRR Number:    Discharge Plan: Domiciliary (Rest home)    Current Diagnoses: Patient Active Problem List   Diagnosis Date Noted   Acute metabolic encephalopathy 06/15/2021   Seizure (HCC) 06/14/2021   Dementia without behavioral disturbance (HCC) 02/18/2020   Vaginal irritation from pessary (HCC) 11/01/2019   Split S1 (first heart sound) 07/17/2014   GAD (generalized anxiety disorder) 07/17/2014   Hyperlipidemia    Hypertension     Orientation RESPIRATION BLADDER Height & Weight     Self  Normal External catheter, Incontinent Weight: 65 kg Height:  5\' 3"  (160 cm)  BEHAVIORAL SYMPTOMS/MOOD NEUROLOGICAL BOWEL NUTRITION STATUS      Incontinent Diet (regular)  AMBULATORY STATUS COMMUNICATION OF NEEDS Skin   Extensive Assist Verbally Skin abrasions                       Personal Care Assistance Level of Assistance  Bathing, Feeding, Dressing Bathing Assistance: Maximum assistance Feeding assistance: Maximum assistance Dressing Assistance: Maximum assistance     Functional Limitations Info  Sight, Hearing, Speech Sight Info: Impaired Hearing Info: Adequate Speech Info: Adequate    SPECIAL CARE FACTORS FREQUENCY                       Contractures Contractures Info: Not present    Additional Factors Info  Code  Status, Allergies Code Status Info: DNR Allergies Info: NKDA           Current Medications (06/16/2021):  This is the current hospital active medication list Current Facility-Administered Medications  Medication Dose Route Frequency Provider Last Rate Last Admin   0.9 %  sodium chloride infusion  75 mL/hr Intravenous Continuous Norins, 14/12/2020, MD 75 mL/hr at 06/15/21 0625 Infusion Verify at 06/15/21 0625   0.9 %  sodium chloride infusion   Intravenous Continuous Tat, David, MD 75 mL/hr at 06/15/21 1618 New Bag at 06/15/21 1618   citalopram (CELEXA) tablet 20 mg  20 mg Oral Daily Norins, 14/05/22, MD   20 mg at 06/16/21 0830   clonazePAM (KLONOPIN) tablet 0.5 mg  0.5 mg Oral BID PRN 14/06/22, MD   0.5 mg at 06/14/21 1839   [START ON 06/17/2021] divalproex (DEPAKOTE SPRINKLE) capsule 250 mg  250 mg Oral q morning Tat, David, MD       divalproex (DEPAKOTE SPRINKLE) capsule 500 mg  500 mg Oral QHS Tat, 14/01/2021, MD       enoxaparin (LOVENOX) injection 40 mg  40 mg Subcutaneous Q24H Norins, Onalee Hua, MD   40 mg at 06/15/21 1614   haloperidol lactate (HALDOL) injection 1 mg  1 mg Intravenous Q6H PRN Norins, 14/05/22, MD   1 mg at 06/14/21 1804   LORazepam (ATIVAN) injection 4 mg  4 mg Intravenous Q5 Min x 2  PRN Norins, Rosalyn Gess, MD       magnesium hydroxide (MILK OF MAGNESIA) suspension 5 mL  5 mL Oral Daily PRN Norins, Rosalyn Gess, MD       senna (SENOKOT) tablet 8.6 mg  1 tablet Oral BID Jacques Navy, MD   8.6 mg at 06/16/21 3536   simvastatin (ZOCOR) tablet 20 mg  20 mg Oral QHS Norins, Rosalyn Gess, MD   20 mg at 06/15/21 2010   vitamin B-12 (CYANOCOBALAMIN) tablet 1,000 mcg  1,000 mcg Oral Daily Jacques Navy, MD   1,000 mcg at 06/16/21 0830   Current Outpatient Medications  Medication Sig Dispense Refill   Cholecalciferol (D3-1000) 25 MCG (1000 UT) tablet Take 1,000 Units by mouth daily.     clonazePAM (KLONOPIN) 0.5 MG tablet Take 0.25 mg by mouth 2 (two) times daily as  needed for anxiety.     escitalopram (LEXAPRO) 5 MG tablet Take 5 mg by mouth daily.     magnesium hydroxide (MILK OF MAGNESIA) 400 MG/5ML suspension Take 30 mLs by mouth daily as needed for mild constipation.     simvastatin (ZOCOR) 20 MG tablet Take 20 mg by mouth at bedtime.     vitamin B-12 (CYANOCOBALAMIN) 1000 MCG tablet Take 1,000 mcg by mouth daily.     [START ON 06/17/2021] divalproex (DEPAKOTE SPRINKLE) 125 MG capsule Take 2 capsules (250 mg total) by mouth every morning. 60 capsule 0   divalproex (DEPAKOTE SPRINKLE) 125 MG capsule Take 4 capsules (500 mg total) by mouth at bedtime. 120 capsule 0     Discharge Medications: Medication List       STOP taking these medications     sertraline 50 MG tablet Commonly known as: Zoloft           TAKE these medications     clonazePAM 0.5 MG tablet Commonly known as: KLONOPIN Take 0.25 mg by mouth 2 (two) times daily as needed for anxiety.    D3-1000 25 MCG (1000 UT) tablet Generic drug: Cholecalciferol Take 1,000 Units by mouth daily.    divalproex 125 MG capsule Commonly known as: DEPAKOTE SPRINKLE Take 4 capsules (500 mg total) by mouth at bedtime.    divalproex 125 MG capsule Commonly known as: DEPAKOTE SPRINKLE Take 2 capsules (250 mg total) by mouth every morning. Start taking on: June 17, 2021    escitalopram 5 MG tablet Commonly known as: LEXAPRO Take 5 mg by mouth daily.    magnesium hydroxide 400 MG/5ML suspension Commonly known as: MILK OF MAGNESIA Take 30 mLs by mouth daily as needed for mild constipation.    simvastatin 20 MG tablet Commonly known as: ZOCOR Take 20 mg by mouth at bedtime.    vitamin B-12 1000 MCG tablet Commonly known as: CYANOCOBALAMIN Take 1,000 mcg by mouth daily.     Relevant Imaging Results:  Relevant Lab Results:   Additional Information SS# [id number]  Leitha Bleak, RN

## 2021-06-16 NOTE — [place] FL2 (Signed)
[place] MEDICAID FL2 LEVEL OF CARE SCREENING TOOL     IDENTIFICATION  Patient Name: Joy Bender Birthdate: June 01, 1951 Sex: female Admission Date (Current Location): 06/14/2021  [facility] and [place] Number:  [employer] and Address:  [facility],  [address], [place] [address]      Provider Number: [id number]  Attending Physician Name and Address:  Catarina Hartshorn, MD  Relative Name and Phone Number:  Mortimer Fries [phone]    Current Level of Care: Hospital Recommended Level of Care: Assisted Living Facility Prior Approval Number:    Date Approved/Denied:   PASRR Number:    Discharge Plan: Domiciliary (Rest home)    Current Diagnoses: Patient Active Problem List   Diagnosis Date Noted   Acute metabolic encephalopathy 06/15/2021   Seizure (HCC) 06/14/2021   Dementia without behavioral disturbance (HCC) 02/18/2020   Vaginal irritation from pessary (HCC) 11/01/2019   Split S1 (first heart sound) 07/17/2014   GAD (generalized anxiety disorder) 07/17/2014   Hyperlipidemia    Hypertension     Orientation RESPIRATION BLADDER Height & Weight     Self  Normal External catheter, Incontinent Weight: 65 kg Height:  5\' 3"  (160 cm)  BEHAVIORAL SYMPTOMS/MOOD NEUROLOGICAL BOWEL NUTRITION STATUS      Incontinent Diet (regular)  AMBULATORY STATUS COMMUNICATION OF NEEDS Skin   Extensive Assist Verbally Skin abrasions                       Personal Care Assistance Level of Assistance  Bathing, Feeding, Dressing Bathing Assistance: Maximum assistance Feeding assistance: Maximum assistance Dressing Assistance: Maximum assistance     Functional Limitations Info  Sight, Hearing, Speech Sight Info: Impaired Hearing Info: Adequate Speech Info: Adequate    SPECIAL CARE FACTORS FREQUENCY                       Contractures Contractures Info: Not present    Additional Factors Info  Code Status, Allergies Code Status Info:  DNR Allergies Info: NKDA           Current Medications (06/16/2021):  This is the current hospital active medication list Current Facility-Administered Medications  Medication Dose Route Frequency Provider Last Rate Last Admin   0.9 %  sodium chloride infusion  75 mL/hr Intravenous Continuous Norins, 14/12/2020, MD 75 mL/hr at 06/15/21 0625 Infusion Verify at 06/15/21 0625   0.9 %  sodium chloride infusion   Intravenous Continuous Tat, David, MD 75 mL/hr at 06/15/21 1618 New Bag at 06/15/21 1618   citalopram (CELEXA) tablet 20 mg  20 mg Oral Daily Norins, 14/05/22, MD   20 mg at 06/16/21 0830   clonazePAM (KLONOPIN) tablet 0.5 mg  0.5 mg Oral BID PRN 14/06/22, MD   0.5 mg at 06/14/21 1839   [START ON 06/17/2021] divalproex (DEPAKOTE SPRINKLE) capsule 250 mg  250 mg Oral q morning Tat, David, MD       divalproex (DEPAKOTE SPRINKLE) capsule 500 mg  500 mg Oral QHS Tat, 14/01/2021, MD       enoxaparin (LOVENOX) injection 40 mg  40 mg Subcutaneous Q24H Norins, Onalee Hua, MD   40 mg at 06/15/21 1614   haloperidol lactate (HALDOL) injection 1 mg  1 mg Intravenous Q6H PRN Norins, 14/05/22, MD   1 mg at 06/14/21 1804   LORazepam (ATIVAN) injection 4 mg  4 mg Intravenous Q5 Min x 2 PRN Norins, 14/04/22, MD  magnesium hydroxide (MILK OF MAGNESIA) suspension 5 mL  5 mL Oral Daily PRN Norins, Rosalyn Gess, MD       senna (SENOKOT) tablet 8.6 mg  1 tablet Oral BID Jacques Navy, MD   8.6 mg at 06/16/21 1779   simvastatin (ZOCOR) tablet 20 mg  20 mg Oral QHS Norins, Rosalyn Gess, MD   20 mg at 06/15/21 2010   vitamin B-12 (CYANOCOBALAMIN) tablet 1,000 mcg  1,000 mcg Oral Daily Jacques Navy, MD   1,000 mcg at 06/16/21 0830     Discharge Medications:  Medication List       STOP taking these medications     sertraline 50 MG tablet Commonly known as: Zoloft           TAKE these medications     clonazePAM 0.5 MG tablet Commonly known as: KLONOPIN Take 0.25 mg by mouth 2 (two)  times daily as needed for anxiety.    D3-1000 25 MCG (1000 UT) tablet Generic drug: Cholecalciferol Take 1,000 Units by mouth daily.    divalproex 125 MG capsule Commonly known as: DEPAKOTE SPRINKLE Take 4 capsules (500 mg total) by mouth at bedtime.    divalproex 125 MG capsule Commonly known as: DEPAKOTE SPRINKLE Take 2 capsules (250 mg total) by mouth every morning. Start taking on: June 17, 2021    escitalopram 5 MG tablet Commonly known as: LEXAPRO Take 5 mg by mouth daily.    magnesium hydroxide 400 MG/5ML suspension Commonly known as: MILK OF MAGNESIA Take 30 mLs by mouth daily as needed for mild constipation.    simvastatin 20 MG tablet Commonly known as: ZOCOR Take 20 mg by mouth at bedtime.    vitamin B-12 1000 MCG tablet Commonly known as: CYANOCOBALAMIN Take 1,000 mcg by mouth daily.           Relevant Imaging Results:  Relevant Lab Results:   Additional Information [id number]# [id number]  Leitha Bleak, RN

## 2021-06-16 NOTE — Discharge Summary (Addendum)
Physician Discharge Summary  Joy Bender [id number] DOB: 1951-04-24 DOA: 06/14/2021  PCP: [name], MD  Admit date: 06/14/2021 Discharge date: 06/16/2021  Admitted From: ALF Disposition:  ALF  Recommendations for Outpatient Follow-up:  Follow up with PCP in 1-2 weeks Please obtain BMP/CBC in one week     Discharge Condition: Stable CODE STATUS:DNR Diet recommendation: Regular   Brief/Interim Summary: 70 year old female with a history of dementia, hypertension, hyperlipidemia, paroxysmal atrial fibrillation, anxiety presenting from [facility] after falling out of her chair.  The patient came to the emergency department via ambulance and had lab work performed as well as radiographs of her lumbar spine, hips and elbow which were all unremarkable.  CT of the brain was negative.  Patient's son stated that she was at her usual mental baseline at that time.  The patient was being discharged from the emergency department.  Her son was getting her into a truck when the patient " stiffened up" and was foaming at the mouth per the son.  She was just staring into space and not responding.  She was brought back into the emergency department.  Her vital signs were stable.  Blood work was performed and showed WBC 18.2, hemoglobin 13.7, platelets 137,000.  She was more lethargic than usual.  She was given Ativan 1 mg in the emergency department. Teleneurology saw the patient and recommended admission and loading with Depakote. Unfortunately, the patient was agitated overnight 06/14/2021 to 06/15/2021, and she was given Haldol 1 mg and Klonopin On 06/16/21, patient's mental status was much improved and back to baseline according to patient's son.  Discharge Diagnoses:  Acute metabolic encephalopathy -Secondary to seizure -12/5 Patient's son states that she is improving but not at baseline -UA negative for pyuria -CT brain negative for acute findings -CT cervical spine negative for  fracture or subluxation -minimize any sedating/hypnotic meds -12/6--mental status back to baseline per son at bedside -discussed with neurology, Dr. [name] back to [facility] with depakote 250 mg in AM, 500 at hs   Seizure disorder -Obtain EEG--moderate diffuse encephalopathy;  no seizure -Continue Depakote -Neurology consult appreciated  Hyperammonemia -decrease depakote to 250 in am, 500 in pm -mental status stable and improved   Leukemoid reaction -Likely secondary to stress demargination -UA negative for pyuria -Chest x-ray without any infiltrates   Anxiety -continue celexa  Discharge Instructions   Allergies as of 06/16/2021   No Known Allergies      Medication List     STOP taking these medications    sertraline 50 MG tablet Commonly known as: Zoloft       TAKE these medications    clonazePAM 0.5 MG tablet Commonly known as: KLONOPIN Take 0.25 mg by mouth 2 (two) times daily as needed for anxiety.   D3-1000 25 MCG (1000 UT) tablet Generic drug: Cholecalciferol Take 1,000 Units by mouth daily.   divalproex 125 MG capsule Commonly known as: DEPAKOTE SPRINKLE Take 4 capsules (500 mg total) by mouth at bedtime.   divalproex 125 MG capsule Commonly known as: DEPAKOTE SPRINKLE Take 2 capsules (250 mg total) by mouth every morning. Start taking on: June 17, 2021   escitalopram 5 MG tablet Commonly known as: LEXAPRO Take 5 mg by mouth daily.   magnesium hydroxide 400 MG/5ML suspension Commonly known as: MILK OF MAGNESIA Take 30 mLs by mouth daily as needed for mild constipation.   simvastatin 20 MG tablet Commonly known as: ZOCOR Take 20 mg by mouth at bedtime.  vitamin B-12 1000 MCG tablet Commonly known as: CYANOCOBALAMIN Take 1,000 mcg by mouth daily.        No Known Allergies  Consultations: neurology   Procedures/Studies: DG Elbow Complete Left  Result Date: 06/14/2021 CLINICAL DATA:  Fall, altered mental status,  dementia. EXAM: LEFT ELBOW - COMPLETE 3+ VIEW COMPARISON:  None. FINDINGS: Osseous alignment is normal. No fracture line or displaced fracture fragment is seen. No degenerative change. No evidence of joint effusion. Surrounding soft tissues are unremarkable. IMPRESSION: Negative. Electronically Signed   By: Bary Richard M.D.   On: 06/14/2021 10:24   CT Head Wo Contrast  Result Date: 06/14/2021 CLINICAL DATA:  Seizure EXAM: CT HEAD WITHOUT CONTRAST TECHNIQUE: Contiguous axial images were obtained from the base of the skull through the vertex without intravenous contrast. COMPARISON:  06/14/2021 at 10:57 a.m. FINDINGS: Brain: As on the prior exam, the patient was not able to assist with positioning and as result, the axial images are somewhat distorted by head tilt and head turning. Motion artifact noted. Periventricular white matter and corona radiata hypodensities favor chronic ischemic microvascular white matter disease. Otherwise, the brainstem, cerebellum, cerebral peduncles, thalamus, basal ganglia, basilar cisterns, and ventricular system appear within normal limits. No intracranial hemorrhage, mass lesion, or acute CVA. Vascular: There is atherosclerotic calcification of the cavernous carotid arteries bilaterally. Skull: Unremarkable Sinuses/Orbits: Unremarkable Other: No supplemental non-categorized findings. IMPRESSION: 1. No acute intracranial findings. 2. Periventricular white matter and corona radiata hypodensities favor chronic ischemic microvascular white matter disease. Electronically Signed   By: Gaylyn Rong M.D.   On: 06/14/2021 13:39   CT Head Wo Contrast  Result Date: 06/14/2021 CLINICAL DATA:  Head trauma, fall EXAM: CT HEAD WITHOUT CONTRAST CT CERVICAL SPINE WITHOUT CONTRAST TECHNIQUE: Multidetector CT imaging of the head and cervical spine was performed following the standard protocol without intravenous contrast. Multiplanar CT image reconstructions of the cervical spine were  also generated. COMPARISON:  05/31/2021 FINDINGS: CT HEAD FINDINGS Brain: No evidence of acute infarction, hemorrhage, hydrocephalus, extra-axial collection or mass lesion/mass effect. Periventricular and deep white matter hypodensity. Vascular: No hyperdense vessel or unexpected calcification. Skull: Normal. Negative for fracture or focal lesion. Sinuses/Orbits: No acute finding. Other: None. CT CERVICAL SPINE FINDINGS Alignment: Straightening of the normal cervical lordosis, likely positional. Skull base and vertebrae: No acute fracture. No primary bone lesion or focal pathologic process. Soft tissues and spinal canal: No prevertebral fluid or swelling. No visible canal hematoma. Disc levels:  Intact. Upper chest: Negative. Other: None. IMPRESSION: 1. No acute intracranial pathology. Small-vessel white matter disease. 2. No fracture or static subluxation of the cervical spine. Electronically Signed   By: Jearld Lesch M.D.   On: 06/14/2021 11:41   CT Head Wo Contrast  Result Date: 05/31/2021 CLINICAL DATA:  [place]arey Seat, left-sided scalp hematoma, dimension EXAM: CT HEAD WITHOUT CONTRAST TECHNIQUE: Contiguous axial images were obtained from the base of the skull through the vertex without intravenous contrast. COMPARISON:  01/01/2021 FINDINGS: Brain: No acute infarct or hemorrhage. Scattered hypodensities in the periventricular white matter are stable, consistent with chronic small vessel ischemic change. Lateral ventricles and midline structures are otherwise unremarkable. No acute extra-axial fluid collections. No mass effect. Vascular: No hyperdense vessel or unexpected calcification. Skull: Minimal scalp edema along the left parietal convexity. No acute calvarial fracture. Sinuses/Orbits: No acute finding. Other: None. IMPRESSION: 1. Stable head CT, no acute intracranial process. Electronically Signed   By: Sharlet Salina M.D.   On: 05/31/2021 20:05   CT Cervical Spine  Wo Contrast  Result Date:  06/14/2021 CLINICAL DATA:  Head trauma, fall EXAM: CT HEAD WITHOUT CONTRAST CT CERVICAL SPINE WITHOUT CONTRAST TECHNIQUE: Multidetector CT imaging of the head and cervical spine was performed following the standard protocol without intravenous contrast. Multiplanar CT image reconstructions of the cervical spine were also generated. COMPARISON:  05/31/2021 FINDINGS: CT HEAD FINDINGS Brain: No evidence of acute infarction, hemorrhage, hydrocephalus, extra-axial collection or mass lesion/mass effect. Periventricular and deep white matter hypodensity. Vascular: No hyperdense vessel or unexpected calcification. Skull: Normal. Negative for fracture or focal lesion. Sinuses/Orbits: No acute finding. Other: None. CT CERVICAL SPINE FINDINGS Alignment: Straightening of the normal cervical lordosis, likely positional. Skull base and vertebrae: No acute fracture. No primary bone lesion or focal pathologic process. Soft tissues and spinal canal: No prevertebral fluid or swelling. No visible canal hematoma. Disc levels:  Intact. Upper chest: Negative. Other: None. IMPRESSION: 1. No acute intracranial pathology. Small-vessel white matter disease. 2. No fracture or static subluxation of the cervical spine. Electronically Signed   By: Delanna Ahmadi M.D.   On: 06/14/2021 11:41   DG Chest Port 1 View  Result Date: 06/14/2021 CLINICAL DATA:  Seizure. EXAM: PORTABLE CHEST 1 VIEW COMPARISON:  Chest x-ray dated 05/08/2021. FINDINGS: Heart size and mediastinal contours are stable. Lungs are clear. No pleural effusion or pneumothorax is seen. Pacer pad overlies the LEFT heart border. IMPRESSION: No active disease. No evidence of pneumonia or pulmonary edema. Electronically Signed   By: Franki Cabot M.D.   On: 06/14/2021 14:04   EEG adult  Result Date: 06/15/2021 Lora Havens, MD     06/15/2021  3:00 PM Patient Name: Joy Bender MRN: [id number] Epilepsy Attending: Lora Havens Referring Physician/Provider: Dr. Adella Hare Date: 06/15/2021 Duration: Patient history: 70 year old female with history of dementia presented with new onset seizures.  EEG to evaluate for seizure. Level of alertness: Lethargic, asleep AEDs during EEG study: VPA Technical aspects: This EEG study was done with scalp electrodes positioned according to the 10-20 International system of electrode placement. Electrical activity was acquired at a sampling rate of 500Hz  and reviewed with a high frequency filter of 70Hz  and a low frequency filter of 1Hz . EEG data were recorded continuously and digitally stored. Description: No clear posterior dominant rhythm was seen. Sleep was characterized by sleep spindles (12 to 14 Hz), maximal frontocentral region.  EEG showed continuous generalized 5 to 6 Hz theta as well as intermittent generalized 2 to 3 Hz delta slowing, at times or triphasic morphology.  Hyperventilation and photic stimulation were not performed.   ABNORMALITY - Continuous slow, generalized IMPRESSION: This study is suggestive of moderate diffuse encephalopathy, nonspecific etiology but could be secondary to toxic-metabolic causes.  No seizures or definite epileptiform discharges were seen throughout the recording. Priyanka Barbra Sarks   DG Hips Bilat W or Wo Pelvis 3-4 Views  Result Date: 06/14/2021 CLINICAL DATA:  Patient fell at nursing home. EXAM: DG HIP (WITH OR WITHOUT PELVIS) 3-4V BILAT COMPARISON:  None. FINDINGS: There is no evidence of hip fracture or dislocation. There is no evidence of arthropathy or other focal bone abnormality. IMPRESSION: Negative. Electronically Signed   By: Misty Stanley M.D.   On: 06/14/2021 10:24        Discharge Exam: Vitals:   06/15/21 1917 06/16/21 0602  BP: 116/71 122/62  Pulse: 66 64  Resp: 18 15  Temp: 98.5 F (36.9 C) 98.6 F (37 C)  SpO2: 99% 98%   Vitals:  06/15/21 0437 06/15/21 1227 06/15/21 1917 06/16/21 0602  BP: (!) 148/75 115/75 116/71 122/62  Pulse: 76 82 66 64  Resp: 18 16 18 15    Temp: 98.3 F (36.8 C) 97.8 F (36.6 C) 98.5 F (36.9 C) 98.6 F (37 C)  TempSrc:  Oral    SpO2: 99% 100% 99% 98%  Weight:      Height:        General: Pt is alert, awake, not in acute distress Cardiovascular: RRR, S1/S2 +, no rubs, no gallops Respiratory: poor inspiratory effort, but CTA Abdominal: Soft, NT, ND, bowel sounds + Extremities: no edema, no cyanosis   The results of significant diagnostics from this hospitalization (including imaging, microbiology, ancillary and laboratory) are listed below for reference.    Significant Diagnostic Studies: DG Elbow Complete Left  Result Date: 06/14/2021 CLINICAL DATA:  Fall, altered mental status, dementia. EXAM: LEFT ELBOW - COMPLETE 3+ VIEW COMPARISON:  None. FINDINGS: Osseous alignment is normal. No fracture line or displaced fracture fragment is seen. No degenerative change. No evidence of joint effusion. Surrounding soft tissues are unremarkable. IMPRESSION: Negative. Electronically Signed   By: Franki Cabot M.D.   On: 06/14/2021 10:24   CT Head Wo Contrast  Result Date: 06/14/2021 CLINICAL DATA:  Seizure EXAM: CT HEAD WITHOUT CONTRAST TECHNIQUE: Contiguous axial images were obtained from the base of the skull through the vertex without intravenous contrast. COMPARISON:  06/14/2021 at 10:57 a.m. FINDINGS: Brain: As on the prior exam, the patient was not able to assist with positioning and as result, the axial images are somewhat distorted by head tilt and head turning. Motion artifact noted. Periventricular white matter and corona radiata hypodensities favor chronic ischemic microvascular white matter disease. Otherwise, the brainstem, cerebellum, cerebral peduncles, thalamus, basal ganglia, basilar cisterns, and ventricular system appear within normal limits. No intracranial hemorrhage, mass lesion, or acute CVA. Vascular: There is atherosclerotic calcification of the cavernous carotid arteries bilaterally. Skull: Unremarkable  Sinuses/Orbits: Unremarkable Other: No supplemental non-categorized findings. IMPRESSION: 1. No acute intracranial findings. 2. Periventricular white matter and corona radiata hypodensities favor chronic ischemic microvascular white matter disease. Electronically Signed   By: Van Clines M.D.   On: 06/14/2021 13:39   CT Head Wo Contrast  Result Date: 06/14/2021 CLINICAL DATA:  Head trauma, fall EXAM: CT HEAD WITHOUT CONTRAST CT CERVICAL SPINE WITHOUT CONTRAST TECHNIQUE: Multidetector CT imaging of the head and cervical spine was performed following the standard protocol without intravenous contrast. Multiplanar CT image reconstructions of the cervical spine were also generated. COMPARISON:  05/31/2021 FINDINGS: CT HEAD FINDINGS Brain: No evidence of acute infarction, hemorrhage, hydrocephalus, extra-axial collection or mass lesion/mass effect. Periventricular and deep white matter hypodensity. Vascular: No hyperdense vessel or unexpected calcification. Skull: Normal. Negative for fracture or focal lesion. Sinuses/Orbits: No acute finding. Other: None. CT CERVICAL SPINE FINDINGS Alignment: Straightening of the normal cervical lordosis, likely positional. Skull base and vertebrae: No acute fracture. No primary bone lesion or focal pathologic process. Soft tissues and spinal canal: No prevertebral fluid or swelling. No visible canal hematoma. Disc levels:  Intact. Upper chest: Negative. Other: None. IMPRESSION: 1. No acute intracranial pathology. Small-vessel white matter disease. 2. No fracture or static subluxation of the cervical spine. Electronically Signed   By: Delanna Ahmadi M.D.   On: 06/14/2021 11:41   CT Head Wo Contrast  Result Date: 05/31/2021 CLINICAL DATA:  [place], left-sided scalp hematoma, dimension EXAM: CT HEAD WITHOUT CONTRAST TECHNIQUE: Contiguous axial images were obtained from the base of the  skull through the vertex without intravenous contrast. COMPARISON:  01/01/2021 FINDINGS:  Brain: No acute infarct or hemorrhage. Scattered hypodensities in the periventricular white matter are stable, consistent with chronic small vessel ischemic change. Lateral ventricles and midline structures are otherwise unremarkable. No acute extra-axial fluid collections. No mass effect. Vascular: No hyperdense vessel or unexpected calcification. Skull: Minimal scalp edema along the left parietal convexity. No acute calvarial fracture. Sinuses/Orbits: No acute finding. Other: None. IMPRESSION: 1. Stable head CT, no acute intracranial process. Electronically Signed   By: Randa Ngo M.D.   On: 05/31/2021 20:05   CT Cervical Spine Wo Contrast  Result Date: 06/14/2021 CLINICAL DATA:  Head trauma, fall EXAM: CT HEAD WITHOUT CONTRAST CT CERVICAL SPINE WITHOUT CONTRAST TECHNIQUE: Multidetector CT imaging of the head and cervical spine was performed following the standard protocol without intravenous contrast. Multiplanar CT image reconstructions of the cervical spine were also generated. COMPARISON:  05/31/2021 FINDINGS: CT HEAD FINDINGS Brain: No evidence of acute infarction, hemorrhage, hydrocephalus, extra-axial collection or mass lesion/mass effect. Periventricular and deep white matter hypodensity. Vascular: No hyperdense vessel or unexpected calcification. Skull: Normal. Negative for fracture or focal lesion. Sinuses/Orbits: No acute finding. Other: None. CT CERVICAL SPINE FINDINGS Alignment: Straightening of the normal cervical lordosis, likely positional. Skull base and vertebrae: No acute fracture. No primary bone lesion or focal pathologic process. Soft tissues and spinal canal: No prevertebral fluid or swelling. No visible canal hematoma. Disc levels:  Intact. Upper chest: Negative. Other: None. IMPRESSION: 1. No acute intracranial pathology. Small-vessel white matter disease. 2. No fracture or static subluxation of the cervical spine. Electronically Signed   By: Delanna Ahmadi M.D.   On: 06/14/2021  11:41   DG Chest Port 1 View  Result Date: 06/14/2021 CLINICAL DATA:  Seizure. EXAM: PORTABLE CHEST 1 VIEW COMPARISON:  Chest x-ray dated 05/08/2021. FINDINGS: Heart size and mediastinal contours are stable. Lungs are clear. No pleural effusion or pneumothorax is seen. Pacer pad overlies the LEFT heart border. IMPRESSION: No active disease. No evidence of pneumonia or pulmonary edema. Electronically Signed   By: Franki Cabot M.D.   On: 06/14/2021 14:04   EEG adult  Result Date: 06/15/2021 Lora Havens, MD     06/15/2021  3:00 PM Patient Name: Joy Bender MRN: [id number] Epilepsy Attending: Lora Havens Referring Physician/Provider: Dr. Adella Hare Date: 06/15/2021 Duration: Patient history: 70 year old female with history of dementia presented with new onset seizures.  EEG to evaluate for seizure. Level of alertness: Lethargic, asleep AEDs during EEG study: VPA Technical aspects: This EEG study was done with scalp electrodes positioned according to the 10-20 International system of electrode placement. Electrical activity was acquired at a sampling rate of 500Hz  and reviewed with a high frequency filter of 70Hz  and a low frequency filter of 1Hz . EEG data were recorded continuously and digitally stored. Description: No clear posterior dominant rhythm was seen. Sleep was characterized by sleep spindles (12 to 14 Hz), maximal frontocentral region.  EEG showed continuous generalized 5 to 6 Hz theta as well as intermittent generalized 2 to 3 Hz delta slowing, at times or triphasic morphology.  Hyperventilation and photic stimulation were not performed.   ABNORMALITY - Continuous slow, generalized IMPRESSION: This study is suggestive of moderate diffuse encephalopathy, nonspecific etiology but could be secondary to toxic-metabolic causes.  No seizures or definite epileptiform discharges were seen throughout the recording. Priyanka Barbra Sarks   DG Hips Bilat W or Wo Pelvis 3-4 Views  Result Date:  06/14/2021  CLINICAL DATA:  Patient fell at nursing home. EXAM: DG HIP (WITH OR WITHOUT PELVIS) 3-4V BILAT COMPARISON:  None. FINDINGS: There is no evidence of hip fracture or dislocation. There is no evidence of arthropathy or other focal bone abnormality. IMPRESSION: Negative. Electronically Signed   By: Misty Stanley M.D.   On: 06/14/2021 10:24    Microbiology: Recent Results (from the past 240 hour(s))  Resp Panel by RT-PCR (Flu A&B, Covid) Nasopharyngeal Swab     Status: None   Collection Time: 06/14/21  1:58 PM   Specimen: Nasopharyngeal Swab; Nasopharyngeal(NP) swabs in vial transport medium  Result Value Ref Range Status   SARS Coronavirus 2 by RT PCR NEGATIVE NEGATIVE Final    Comment: (NOTE) SARS-CoV-2 target nucleic acids are NOT DETECTED.  The SARS-CoV-2 RNA is generally detectable in upper respiratory specimens during the acute phase of infection. The lowest concentration of SARS-CoV-2 viral copies this assay can detect is 138 copies/mL. A negative result does not preclude SARS-Cov-2 infection and should not be used as the sole basis for treatment or other patient management decisions. A negative result may occur with  improper specimen collection/handling, submission of specimen other than nasopharyngeal swab, presence of viral mutation(s) within the areas targeted by this assay, and inadequate number of viral copies(<138 copies/mL). A negative result must be combined with clinical observations, patient history, and epidemiological information. The expected result is Negative.  Fact Sheet for Patients:  EntrepreneurPulse.com.au  Fact Sheet for Healthcare Providers:  IncredibleEmployment.be  This test is no t yet approved or cleared by the [place] FDA and  has been authorized for detection and/or diagnosis of SARS-CoV-2 by FDA under an Emergency Use Authorization (EUA). This EUA will remain  in effect (meaning this test can be  used) for the duration of the COVID-19 declaration under Section 564(b)(1) of the Act, 21 U.S.C.section 360bbb-3(b)(1), unless the authorization is terminated  or revoked sooner.       Influenza A by PCR NEGATIVE NEGATIVE Final   Influenza B by PCR NEGATIVE NEGATIVE Final    Comment: (NOTE) The Xpert Xpress SARS-CoV-2/FLU/RSV plus assay is intended as an aid in the diagnosis of influenza from Nasopharyngeal swab specimens and should not be used as a sole basis for treatment. Nasal washings and aspirates are unacceptable for Xpert Xpress SARS-CoV-2/FLU/RSV testing.  Fact Sheet for Patients: EntrepreneurPulse.com.au  Fact Sheet for Healthcare Providers: IncredibleEmployment.be  This test is not yet approved or cleared by the [place] FDA and has been authorized for detection and/or diagnosis of SARS-CoV-2 by FDA under an Emergency Use Authorization (EUA). This EUA will remain in effect (meaning this test can be used) for the duration of the COVID-19 declaration under Section 564(b)(1) of the Act, 21 U.S.C. section 360bbb-3(b)(1), unless the authorization is terminated or revoked.  Performed at [facility], [address] [place], [place] [address]   MRSA Next Gen by PCR, Nasal     Status: None   Collection Time: 06/14/21  5:57 PM   Specimen: Nasal Mucosa; Nasal Swab  Result Value Ref Range Status   MRSA by PCR Next Gen NOT DETECTED NOT DETECTED Final    Comment: (NOTE) The GeneXpert MRSA Assay (FDA approved for NASAL specimens only), is one component of a comprehensive MRSA colonization surveillance program. It is not intended to diagnose MRSA infection nor to guide or monitor treatment for MRSA infections. Test performance is not FDA approved in patients less than 5 years old. Performed at [facility], [place]  [address], [place], [place] [address]      Labs: Basic Metabolic Panel: Recent Labs  Lab 06/14/21 1354 06/16/21 0544   NA 138 139  K 4.4 3.3*  CL 104 109  CO2 23 23  GLUCOSE 106* 78  BUN 16 13  CREATININE 0.65 0.56  CALCIUM 8.6* 8.0*  MG 2.1  --    Liver Function Tests: Recent Labs  Lab 06/14/21 1354  AST 34  ALT 24  ALKPHOS 97  BILITOT 0.6  PROT 6.5  ALBUMIN 3.3*   No results for input(s): LIPASE, AMYLASE in the last 168 hours. Recent Labs  Lab 06/15/21 1116 06/16/21 0544  AMMONIA 26 44*   CBC: Recent Labs  Lab 06/14/21 1306 06/15/21 1116 06/16/21 0544  WBC 18.2* 7.5 6.3  NEUTROABS 11.6*  --   --   HGB 13.7 11.3* 11.7*  HCT 42.1 33.3* 35.5*  MCV 97.9 95.7 96.2  PLT 337 221 203   Cardiac Enzymes: No results for input(s): CKTOTAL, CKMB, CKMBINDEX, TROPONINI in the last 168 hours. BNP: Invalid input(s): POCBNP CBG: Recent Labs  Lab 06/14/21 1258  GLUCAP 108*    Time coordinating discharge:  36 minutes  Signed:  Orson Eva, DO [facility] Pager: [phone] 06/16/2021, 10:38 AM

## 2021-06-16 NOTE — TOC Transition Note (Signed)
Transition of Care [facility]) - CM/SW Discharge Note   Patient Details  Name: GAELYN TUKES MRN: [id number] Date of Birth: 1950/12/13  Transition of Care [facility]) CM/SW Contact:  Leitha Bleak, RN Phone Number: 06/16/2021, 12:42 PM   Clinical Narrative:  Berenice Primas updated patient is medically ready to discharge back to facility today. [facility] competed and faxed along with DC summary. Son will transport his mother. [employer] updated.   Final next level of care: Assisted Living Barriers to Discharge: Barriers Resolved   Patient Goals and CMS Choice Patient states their goals for this hospitalization and ongoing recovery are:: to go back to ALF CMS Medicare.gov Compare Post Acute Care list provided to:: Patient Represenative (must comment) Choice offered to / list presented to : Adult Children  Discharge Placement               Patient to be transferred to facility by: SON Name of family member notified: Son Patient and family notified of of transfer: 06/16/21  Discharge Plan and Services        Readmission Risk Interventions Readmission Risk Prevention Plan 06/16/2021  Transportation Screening Complete  PCP or Specialist Appt within 5-7 Days Complete  Home Care Screening Complete  Medication Review (RN CM) Complete  Some recent data might be hidden

## 2021-06-16 NOTE — Care Plan (Signed)
Patient's ammonia is slightly elevated at 44.  This is most likely secondary to Depakote use.  We will reduce Depakote dose to 250 mg QAM and continue 500 mg QHS.   Discussed plan with Dr. Arbutus Leas.  Joy Bender

## 2021-06-18 DIAGNOSIS — E559 Vitamin D deficiency, unspecified: Secondary | ICD-10-CM | POA: Diagnosis not present

## 2021-06-18 DIAGNOSIS — J302 Other seasonal allergic rhinitis: Secondary | ICD-10-CM | POA: Diagnosis not present

## 2021-06-18 DIAGNOSIS — F32A Depression, unspecified: Secondary | ICD-10-CM | POA: Diagnosis not present

## 2021-06-18 DIAGNOSIS — S0083XA Contusion of other part of head, initial encounter: Secondary | ICD-10-CM | POA: Diagnosis not present

## 2021-06-18 DIAGNOSIS — I129 Hypertensive chronic kidney disease with stage 1 through stage 4 chronic kidney disease, or unspecified chronic kidney disease: Secondary | ICD-10-CM | POA: Diagnosis not present

## 2021-06-18 DIAGNOSIS — R21 Rash and other nonspecific skin eruption: Secondary | ICD-10-CM | POA: Diagnosis not present

## 2021-06-18 DIAGNOSIS — E785 Hyperlipidemia, unspecified: Secondary | ICD-10-CM | POA: Diagnosis not present

## 2021-06-18 DIAGNOSIS — E538 Deficiency of other specified B group vitamins: Secondary | ICD-10-CM | POA: Diagnosis not present

## 2021-06-19 DIAGNOSIS — D518 Other vitamin B12 deficiency anemias: Secondary | ICD-10-CM | POA: Diagnosis not present

## 2021-06-19 DIAGNOSIS — E119 Type 2 diabetes mellitus without complications: Secondary | ICD-10-CM | POA: Diagnosis not present

## 2021-06-19 DIAGNOSIS — I129 Hypertensive chronic kidney disease with stage 1 through stage 4 chronic kidney disease, or unspecified chronic kidney disease: Secondary | ICD-10-CM | POA: Diagnosis not present

## 2021-06-19 DIAGNOSIS — E038 Other specified hypothyroidism: Secondary | ICD-10-CM | POA: Diagnosis not present

## 2021-06-19 DIAGNOSIS — E559 Vitamin D deficiency, unspecified: Secondary | ICD-10-CM | POA: Diagnosis not present

## 2021-06-19 DIAGNOSIS — E785 Hyperlipidemia, unspecified: Secondary | ICD-10-CM | POA: Diagnosis not present

## 2021-06-19 DIAGNOSIS — I10 Essential (primary) hypertension: Secondary | ICD-10-CM | POA: Diagnosis not present

## 2021-07-07 DIAGNOSIS — I10 Essential (primary) hypertension: Secondary | ICD-10-CM | POA: Diagnosis not present

## 2021-07-15 DIAGNOSIS — E119 Type 2 diabetes mellitus without complications: Secondary | ICD-10-CM | POA: Diagnosis not present

## 2021-07-15 DIAGNOSIS — E038 Other specified hypothyroidism: Secondary | ICD-10-CM | POA: Diagnosis not present

## 2021-07-15 DIAGNOSIS — D518 Other vitamin B12 deficiency anemias: Secondary | ICD-10-CM | POA: Diagnosis not present

## 2021-07-15 DIAGNOSIS — E559 Vitamin D deficiency, unspecified: Secondary | ICD-10-CM | POA: Diagnosis not present

## 2021-08-12 DEATH — deceased

## 2022-10-17 IMAGING — CT CT CERVICAL SPINE W/O CM
3 of 4 series · 14 of 33 positions shown, 17 images · non-contrast
Comparison: None.

CLINICAL DATA: Trauma.  Right eye swollen.

EXAM:
CT MAXILLOFACIAL WITHOUT CONTRAST; CT CERVICAL SPINE WITHOUT
CONTRAST
TECHNIQUE: Multidetector CT imaging of the maxillofacial structures was
performed. Multiplanar CT image reconstructions were also generated.
Multidetector CT imaging of the cervical spine structures was

[Series 5: sag bone · sagittal · 0.30mm/px · 5 of 76 slices shown, 6 images]
[im 26/76  bone]
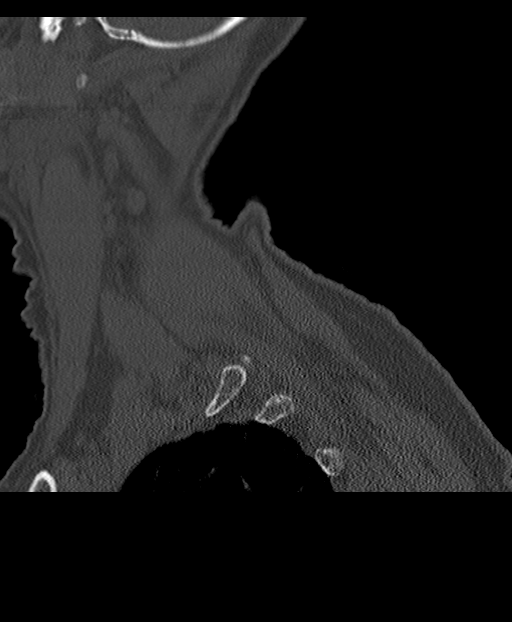
[im 32/76  bone]
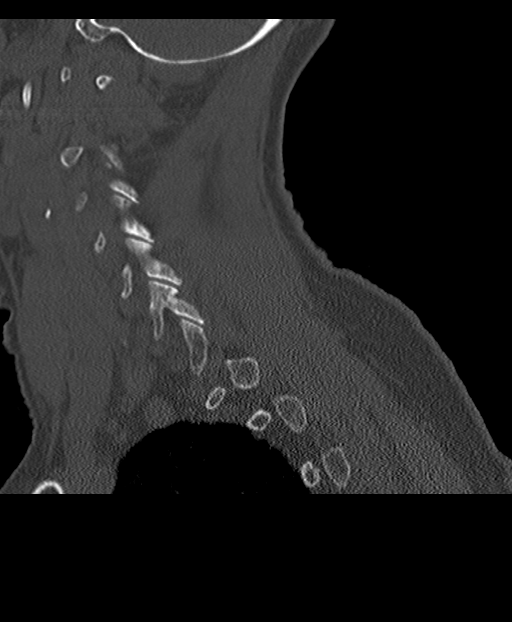
[im 38/76  soft-tissue]
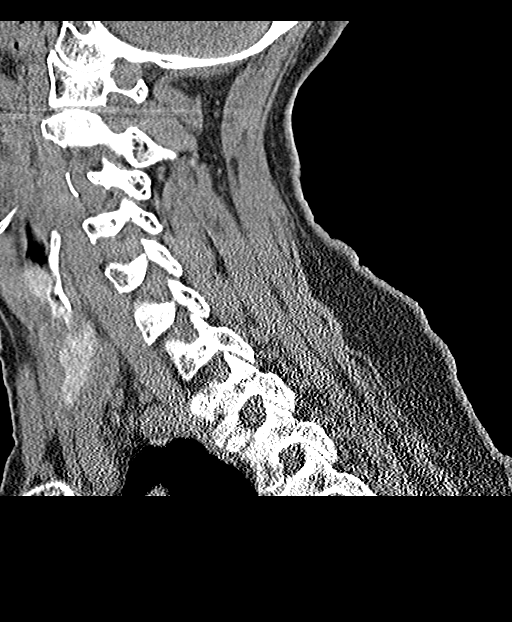
[im 38/76  bone]
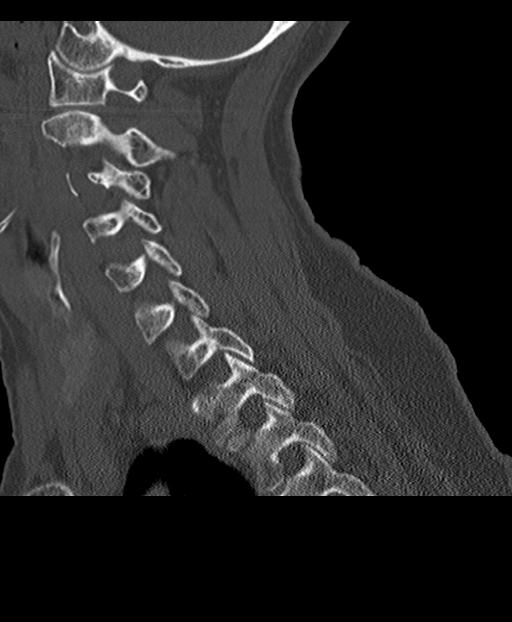
[im 44/76  bone]
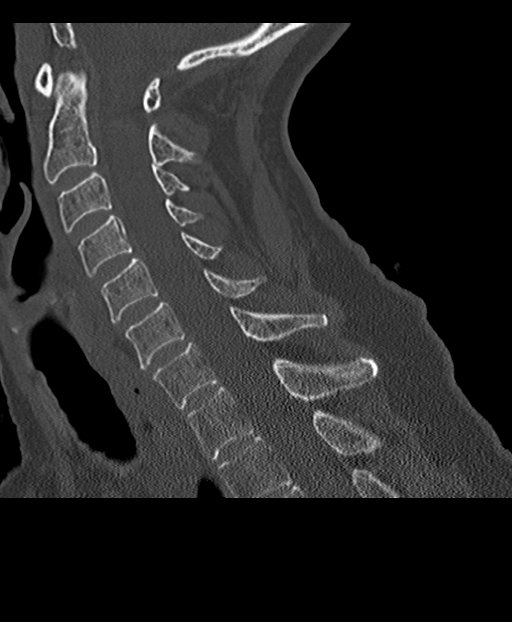
[im 51/76  bone]
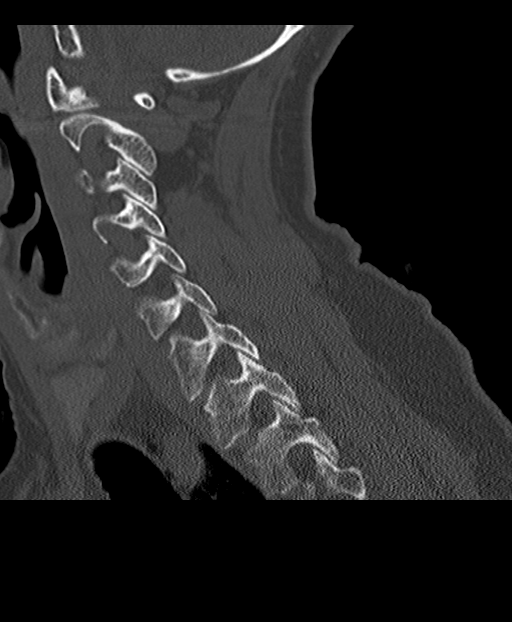

[Series 6: cor bone · coronal · 0.26mm/px · 3 of 110 slices shown]
[im 28/110  bone]
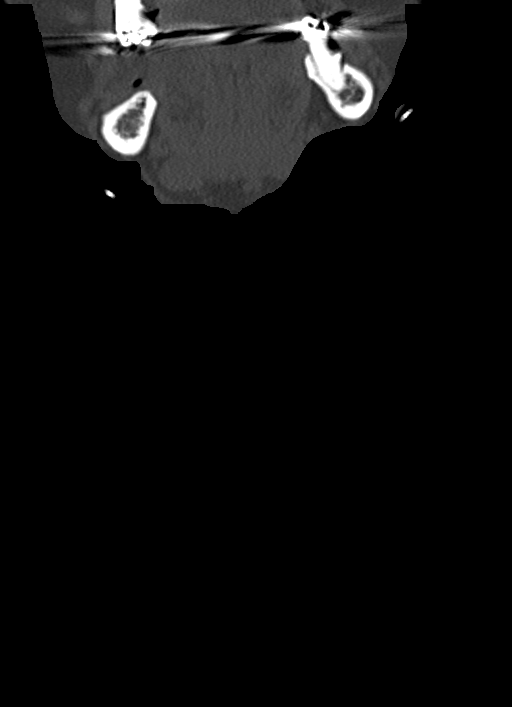
[im 46/110  bone]
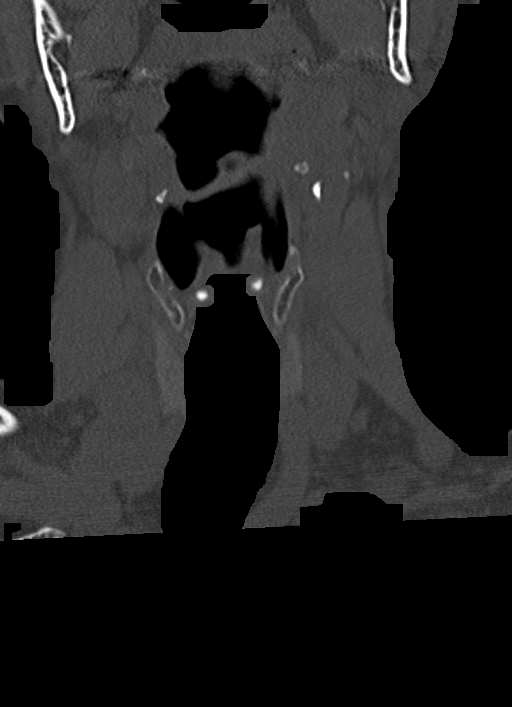
[im 64/110  bone]
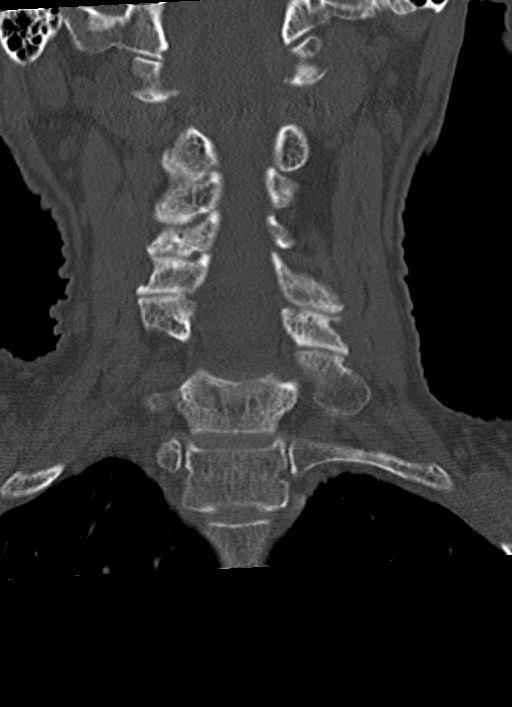

[Series 7: orthogonal axials · axial · 0.21mm/px · z∈[+1538,+1647]mm · 6 of 85 slices shown, 8 images]
[im 13/85  soft-tissue]
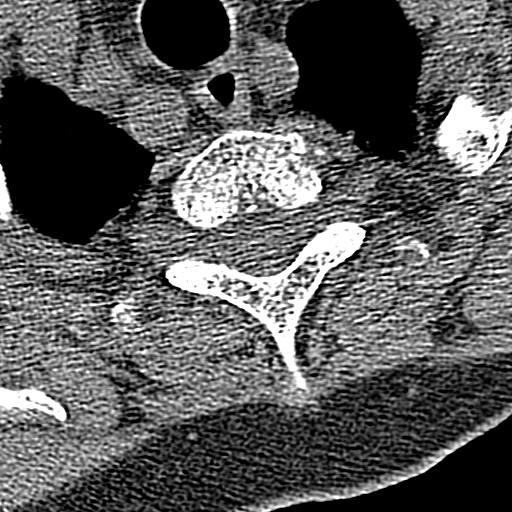
[im 13/85  bone]
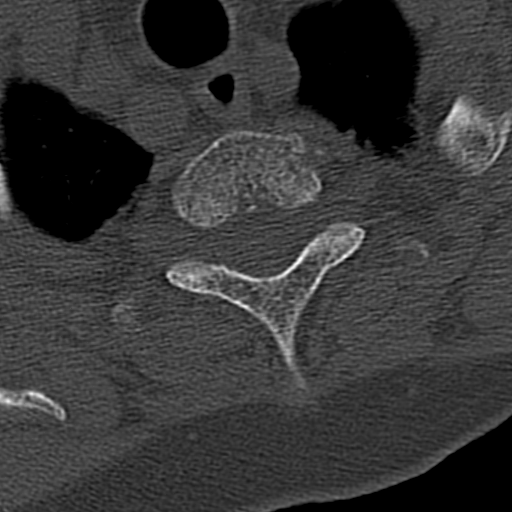
[im 25/85  bone]
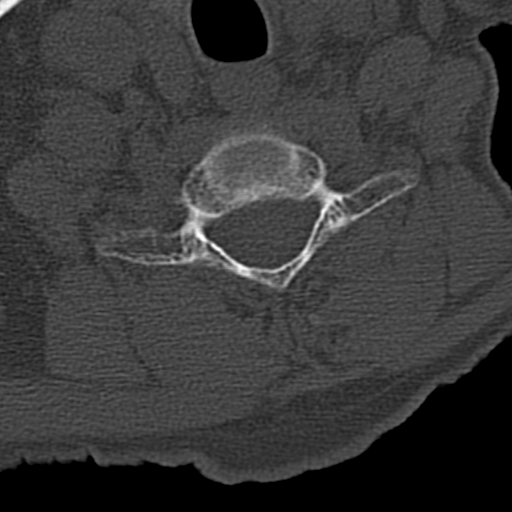
[im 37/85  bone]
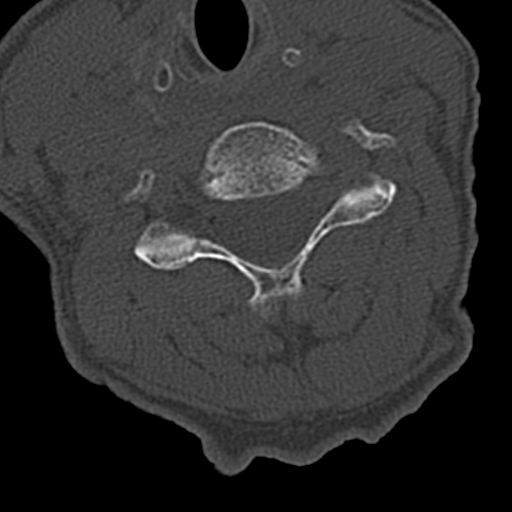
[im 49/85  bone]
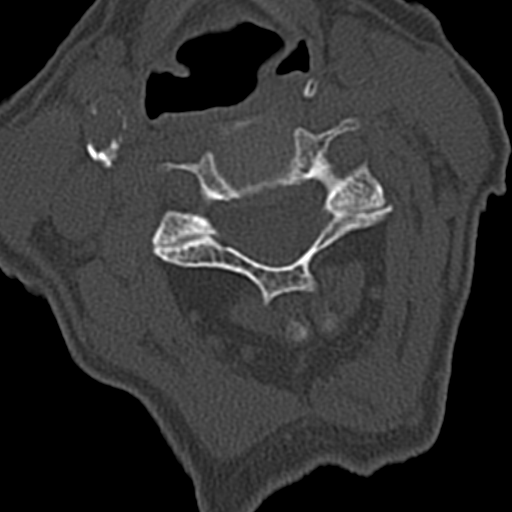
[im 61/85  soft-tissue]
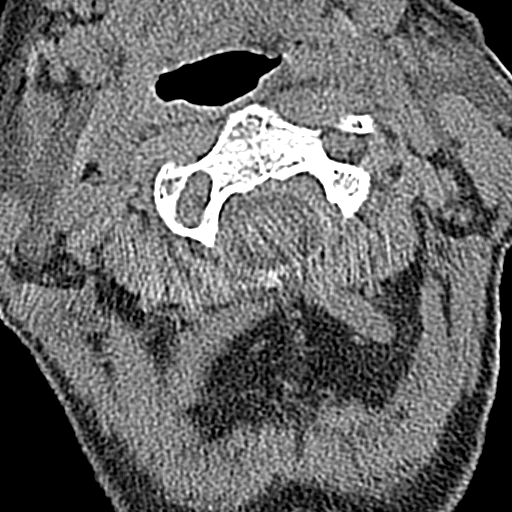
[im 61/85  bone]
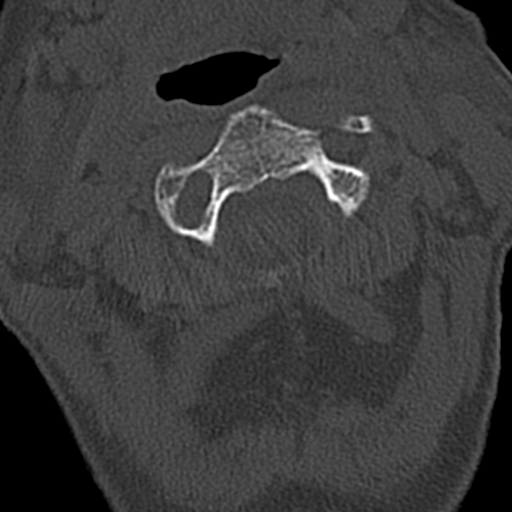
[im 73/85  bone]
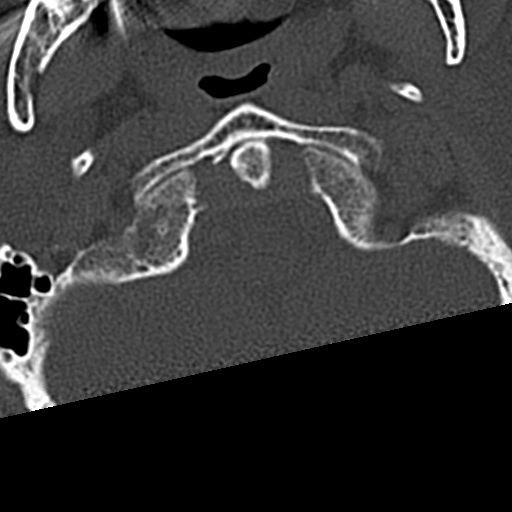

[14 of 33 positions shown; findings below may reference images not displayed]

FINDINGS: CT MAX FACE:

Osseous: No fracture or mandibular dislocation. No destructive
process.

Orbits: Negative. No traumatic or inflammatory finding.

Sinuses: Clear.

Soft tissues: Right periorbital subcutaneus soft tissue edema and
hematoma formation. Edema tracks along the right maxillary soft
tissues.

Limited intracranial: Please see separately dictated CT head
01/01/2021.

CT C SPINE:

AligNment: Normal.

Skull base and vertebrae: No acute fracture. No aggressive appearing
focal osseous lesion or focal pathologic process.

Soft tissues and spinal canal: No prevertebral fluid or swelling. No
visible canal hematoma.

Upper chest: Unremarkable.

Other: At least moderate atherosclerotic plaque of the carotid
arteries within the neck.
IMPRESSION: 1. No acute displaced facial fracture.
2. No acute displaced fracture or traumatic listhesis of the
cervical spine.
3. Please see separately dictated CT head 01/01/2021.
